# Patient Record
Sex: Male | Born: 1935 | Race: White | Hispanic: No | Marital: Married | Smoking: Former smoker
Health system: Southern US, Community
[De-identification: ages and names within clinical notes are randomized; demographics above are authoritative.]

## PROBLEM LIST (undated history)

## (undated) DIAGNOSIS — F0390 Unspecified dementia without behavioral disturbance: Secondary | ICD-10-CM

## (undated) DIAGNOSIS — I2510 Atherosclerotic heart disease of native coronary artery without angina pectoris: Secondary | ICD-10-CM

## (undated) DIAGNOSIS — R001 Bradycardia, unspecified: Secondary | ICD-10-CM

## (undated) DIAGNOSIS — N400 Enlarged prostate without lower urinary tract symptoms: Secondary | ICD-10-CM

## (undated) DIAGNOSIS — N289 Disorder of kidney and ureter, unspecified: Secondary | ICD-10-CM

## (undated) DIAGNOSIS — I10 Essential (primary) hypertension: Secondary | ICD-10-CM

## (undated) HISTORY — PX: OTHER SURGICAL HISTORY: SHX169

## (undated) HISTORY — DX: Unspecified dementia, unspecified severity, without behavioral disturbance, psychotic disturbance, mood disturbance, and anxiety: F03.90

## (undated) HISTORY — PX: CATARACT EXTRACTION, BILATERAL: SHX1313

## (undated) SURGERY — REPAIR, HERNIA, INGUINAL, ADULT
Anesthesia: General | Laterality: Left

---

## 2006-12-24 HISTORY — PX: HERNIA REPAIR: SHX51

## 2007-05-15 ENCOUNTER — Ambulatory Visit: Payer: Self-pay | Admitting: Internal Medicine

## 2007-08-13 ENCOUNTER — Ambulatory Visit: Payer: Self-pay | Admitting: Surgery

## 2007-08-20 ENCOUNTER — Ambulatory Visit: Payer: Self-pay | Admitting: Surgery

## 2011-06-27 ENCOUNTER — Ambulatory Visit: Payer: Self-pay | Admitting: Family Medicine

## 2012-03-25 ENCOUNTER — Ambulatory Visit: Payer: Self-pay | Admitting: Ophthalmology

## 2013-02-21 ENCOUNTER — Ambulatory Visit: Payer: Self-pay | Admitting: Hematology and Oncology

## 2013-03-23 LAB — CBC CANCER CENTER
Basophil #: 0.1 x10 3/mm (ref 0.0–0.1)
HCT: 48.9 % (ref 40.0–52.0)
HGB: 16.3 g/dL (ref 13.0–18.0)
Lymphocyte #: 1.5 x10 3/mm (ref 1.0–3.6)
MCH: 32.4 pg (ref 26.0–34.0)
MCHC: 33.4 g/dL (ref 32.0–36.0)
MCV: 97 fL (ref 80–100)
Monocyte #: 0.7 x10 3/mm (ref 0.2–1.0)
Neutrophil #: 4.2 x10 3/mm (ref 1.4–6.5)
RBC: 5.04 10*6/uL (ref 4.40–5.90)

## 2013-03-23 LAB — BASIC METABOLIC PANEL
Anion Gap: 7 (ref 7–16)
BUN: 13 mg/dL (ref 7–18)
Chloride: 104 mmol/L (ref 98–107)
Co2: 31 mmol/L (ref 21–32)
Creatinine: 1.33 mg/dL — ABNORMAL HIGH (ref 0.60–1.30)
Glucose: 79 mg/dL (ref 65–99)
Osmolality: 282 (ref 275–301)
Potassium: 4.1 mmol/L (ref 3.5–5.1)
Sodium: 142 mmol/L (ref 136–145)

## 2013-03-24 ENCOUNTER — Ambulatory Visit: Payer: Self-pay | Admitting: Hematology and Oncology

## 2013-08-25 ENCOUNTER — Ambulatory Visit: Payer: Self-pay | Admitting: Family Medicine

## 2013-09-18 ENCOUNTER — Ambulatory Visit: Payer: Self-pay | Admitting: Neurology

## 2014-10-14 ENCOUNTER — Ambulatory Visit: Payer: Self-pay | Admitting: Ophthalmology

## 2015-11-21 ENCOUNTER — Emergency Department: Payer: Medicare Other

## 2015-11-21 ENCOUNTER — Emergency Department
Admission: EM | Admit: 2015-11-21 | Discharge: 2015-11-21 | Disposition: A | Discharge status: Other Acute Inpatient Hospital | Payer: Medicare Other | Attending: Emergency Medicine | Admitting: Emergency Medicine

## 2015-11-21 ENCOUNTER — Encounter: Payer: Self-pay | Admitting: Emergency Medicine

## 2015-11-21 DIAGNOSIS — Y9289 Other specified places as the place of occurrence of the external cause: Secondary | ICD-10-CM | POA: Diagnosis not present

## 2015-11-21 DIAGNOSIS — Z7982 Long term (current) use of aspirin: Secondary | ICD-10-CM | POA: Diagnosis not present

## 2015-11-21 DIAGNOSIS — I10 Essential (primary) hypertension: Secondary | ICD-10-CM | POA: Diagnosis not present

## 2015-11-21 DIAGNOSIS — W01198A Fall on same level from slipping, tripping and stumbling with subsequent striking against other object, initial encounter: Secondary | ICD-10-CM | POA: Insufficient documentation

## 2015-11-21 DIAGNOSIS — S065XAA Traumatic subdural hemorrhage with loss of consciousness status unknown, initial encounter: Secondary | ICD-10-CM

## 2015-11-21 DIAGNOSIS — Z043 Encounter for examination and observation following other accident: Secondary | ICD-10-CM | POA: Diagnosis present

## 2015-11-21 DIAGNOSIS — Z79899 Other long term (current) drug therapy: Secondary | ICD-10-CM | POA: Insufficient documentation

## 2015-11-21 DIAGNOSIS — I6200 Nontraumatic subdural hemorrhage, unspecified: Secondary | ICD-10-CM | POA: Insufficient documentation

## 2015-11-21 DIAGNOSIS — S065X9A Traumatic subdural hemorrhage with loss of consciousness of unspecified duration, initial encounter: Secondary | ICD-10-CM

## 2015-11-21 DIAGNOSIS — Z87891 Personal history of nicotine dependence: Secondary | ICD-10-CM | POA: Insufficient documentation

## 2015-11-21 DIAGNOSIS — Y998 Other external cause status: Secondary | ICD-10-CM | POA: Insufficient documentation

## 2015-11-21 DIAGNOSIS — Y9389 Activity, other specified: Secondary | ICD-10-CM | POA: Diagnosis not present

## 2015-11-21 HISTORY — DX: Bradycardia, unspecified: R00.1

## 2015-11-21 HISTORY — DX: Atherosclerotic heart disease of native coronary artery without angina pectoris: I25.10

## 2015-11-21 HISTORY — DX: Essential (primary) hypertension: I10

## 2015-11-21 HISTORY — DX: Disorder of kidney and ureter, unspecified: N28.9

## 2015-11-21 HISTORY — DX: Benign prostatic hyperplasia without lower urinary tract symptoms: N40.0

## 2015-11-21 LAB — CBC WITH DIFFERENTIAL/PLATELET
Basophils Absolute: 0.1 K/uL (ref 0–0.1)
Basophils Relative: 1 %
Eosinophils Absolute: 0.5 K/uL (ref 0–0.7)
Eosinophils Relative: 6 %
HCT: 44 % (ref 40.0–52.0)
Hemoglobin: 14.8 g/dL (ref 13.0–18.0)
Lymphocytes Relative: 17 %
Lymphs Abs: 1.4 K/uL (ref 1.0–3.6)
MCH: 33 pg (ref 26.0–34.0)
MCHC: 33.5 g/dL (ref 32.0–36.0)
MCV: 98.3 fL (ref 80.0–100.0)
Monocytes Absolute: 0.7 K/uL (ref 0.2–1.0)
Monocytes Relative: 8 %
Neutro Abs: 5.6 K/uL (ref 1.4–6.5)
Neutrophils Relative %: 68 %
Platelets: 169 K/uL (ref 150–440)
RBC: 4.48 MIL/uL (ref 4.40–5.90)
RDW: 14.8 % — ABNORMAL HIGH (ref 11.5–14.5)
WBC: 8.2 K/uL (ref 3.8–10.6)

## 2015-11-21 LAB — URINALYSIS COMPLETE WITH MICROSCOPIC (ARMC ONLY)
Bacteria, UA: NONE SEEN
Bilirubin Urine: NEGATIVE
Glucose, UA: NEGATIVE mg/dL
Ketones, ur: NEGATIVE mg/dL
Leukocytes, UA: NEGATIVE
Nitrite: NEGATIVE
Protein, ur: NEGATIVE mg/dL
Specific Gravity, Urine: 1.009 (ref 1.005–1.030)
pH: 6 (ref 5.0–8.0)

## 2015-11-21 LAB — COMPREHENSIVE METABOLIC PANEL
ALBUMIN: 3.5 g/dL (ref 3.5–5.0)
ALT: 21 U/L (ref 17–63)
AST: 24 U/L (ref 15–41)
Alkaline Phosphatase: 73 U/L (ref 38–126)
Anion gap: 4 — ABNORMAL LOW (ref 5–15)
BUN: 21 mg/dL — AB (ref 6–20)
CALCIUM: 9 mg/dL (ref 8.9–10.3)
CHLORIDE: 108 mmol/L (ref 101–111)
CO2: 26 mmol/L (ref 22–32)
Creatinine, Ser: 1.28 mg/dL — ABNORMAL HIGH (ref 0.61–1.24)
GFR calc Af Amer: 57 mL/min — ABNORMAL LOW (ref >60)
GFR, EST NON AFRICAN AMERICAN: 49 mL/min — AB (ref >60)
Glucose, Bld: 86 mg/dL (ref 65–99)
POTASSIUM: 3.9 mmol/L (ref 3.5–5.1)
SODIUM: 138 mmol/L (ref 135–145)
TOTAL PROTEIN: 6.3 g/dL — AB (ref 6.5–8.1)
Total Bilirubin: 1.5 mg/dL — ABNORMAL HIGH (ref 0.3–1.2)

## 2015-11-21 LAB — APTT: aPTT: 30 s (ref 24–36)

## 2015-11-21 LAB — TROPONIN I: Troponin I: <0.03 ng/mL

## 2015-11-21 LAB — PROTIME-INR
INR: 1.09
Prothrombin Time: 14.3 s (ref 11.4–15.0)

## 2015-11-21 MED ORDER — SODIUM CHLORIDE 0.9 % IV SOLN
1000.0000 mg | Freq: Once | INTRAVENOUS | Status: AC
  Administered 2015-11-21: 1000 mg via INTRAVENOUS
  Filled 2015-11-21: qty 10

## 2015-11-21 NOTE — ED Notes (Signed)
[facility].

## 2015-11-21 NOTE — ED Provider Notes (Signed)
[facility] Emergency Department Provider Note  ____________________________________________  Time seen: Approximately 1130 PM  I have reviewed the triage vital signs and the nursing notes.   HISTORY  Chief Complaint Fall    HPI Andrew Bird is a 81 y.o. male with a history of dementia who is presenting today with an inability to ambulate after a fall last night. The patient is having increased falls over the past several weeks. They said that last night he was trying to get his pants off before going to bed when he tripped over his pants and fell onto his buttocks. The patient has also had difficulty over the past several weeks standing straight. He is not claiming any pain at this time. He is post using a walker at home which she just got one day ago. The family denies any head trauma.   Past Medical History  Diagnosis Date  . Renal disorder   . Coronary artery disease   . Enlarged prostate   . Hypertension   . Bradycardia   . BPH (benign prostatic hyperplasia)     There are no active problems to display for this patient.   Past Surgical History  Procedure Laterality Date  . Coronary artery stent      Current Outpatient Rx  Name  Route  Sig  Dispense  Refill  . aspirin EC 81 MG tablet   Oral   Take 81 mg by mouth daily.         Marland Kitchen atorvastatin (LIPITOR) 40 MG tablet   Oral   Take 40 mg by mouth daily.         . cholecalciferol (VITAMIN D) 400 UNITS TABS tablet   Oral   Take 2,000 Units by mouth daily.         . clobetasol cream (TEMOVATE) 0.05 %   Topical   Apply 1 application topically 2 (two) times daily.         . finasteride (PROSCAR) 5 MG tablet   Oral   Take 5 mg by mouth daily.         Marland Kitchen lisinopril (PRINIVIL,ZESTRIL) 10 MG tablet   Oral   Take 10 mg by mouth daily.         . ranitidine (ZANTAC) 150 MG tablet   Oral   Take 150 mg by mouth 2 (two) times daily.         . tamsulosin (FLOMAX) 0.4 MG CAPS  capsule   Oral   Take 0.4 mg by mouth daily.         . timolol (TIMOPTIC) 0.5 % ophthalmic solution   Both Eyes   Place 1 drop into both eyes daily.           Allergies Review of patient's allergies indicates no known allergies.  No family history on file.  Social History Social History  Substance Use Topics  . Smoking status: Former Games developer  . Smokeless tobacco: None  . Alcohol Use: No    Review of Systems Constitutional: No fever/chills Eyes: No visual changes. ENT: No sore throat. Cardiovascular: Denies chest pain. Respiratory: Denies shortness of breath. Gastrointestinal: No abdominal pain.  No nausea, no vomiting.  No diarrhea.  No constipation. Genitourinary: Negative for dysuria. Musculoskeletal: Negative for back pain. Skin: Negative for rash. Neurological: Negative for headaches, focal weakness or numbness.  10-point ROS otherwise negative. However, examination found by the patient's dementia.  ____________________________________________   PHYSICAL EXAM:  VITAL SIGNS: ED Triage Vitals  Enc  Vitals Group     BP 11/21/15 1136 130/89 mmHg     Pulse Rate 11/21/15 1136 72     Resp 11/21/15 1136 18     Temp 11/21/15 1136 97.6 F (36.4 C)     Temp Source 11/21/15 1136 Oral     SpO2 11/21/15 1136 98 %     Weight 11/21/15 1136 171 lb (77.565 kg)     Height --      Head Cir --      Peak Flow --      Pain Score --      Pain Loc --      Pain Edu? --      Excl. in GC? --     Constitutional: Alert and oriented. Well appearing and in no acute distress. Eyes: Conjunctivae are normal. PERRL. EOMI. Head: Atraumatic. Nose: No congestion/rhinnorhea. Mouth/Throat: Mucous membranes are moist.  Oropharynx non-erythematous. Neck: No stridor.  No C-spine tenderness or deformity or step-off. Cardiovascular: Normal rate, regular rhythm. Grossly normal heart sounds.  Good peripheral circulation. Respiratory: Normal respiratory effort.  No retractions. Lungs  CTAB. Gastrointestinal: Soft and nontender. No distention. No abdominal bruits. No CVA tenderness. Musculoskeletal: No lower extremity tenderness nor edema.  No joint effusions. 5 out of 5 strength to bilateral lower extremity sprain no tenderness, step-off or deformity to the spine. No saddle anesthesia. Neurologic:  Normal speech and language. No gross focal neurologic deficits are appreciated. No gait instability. Skin:  Skin is warm, dry and intact. No rash noted. Psychiatric: Mood and affect are normal. Speech and behavior are normal.  ____________________________________________   LABS (all labs ordered are listed, but only abnormal results are displayed)  Labs Reviewed - No data to display ____________________________________________  EKG  ED ECG REPORT I, Schaevitz,  Teena Irani, the attending physician, personally viewed and interpreted this ECG.   Date: 11/21/2015  EKG Time: 1409  Rate: 74  Rhythm: normal sinus rhythm with PVC times one.  Axis: Normal axis  Intervals:right bundle branch block  ST&T Change: No ST segment elevation or depression. No abnormal T-wave inversion.  ____________________________________________  RADIOLOGY  Lumbar spine with degenerative disc and facet changes and chronic and plate compression of L2. Aneurysmal dilatation up to 4.8 cm. Recommended follow-up for ultrasound. No acute osseous abdomen the in the pelvis.  . Subdural left-sided hematoma with acute and chronic components. There is a about a 1.5 cm shift. ____________________________________________   PROCEDURES   CRITICAL CARE Performed by: Arelia Longest   Total critical care time: 35 minutes  Critical care time was exclusive of separately billable procedures and treating other patients.  Critical care was necessary to treat or prevent imminent or life-threatening deterioration.  Critical care was time spent personally by me on the following activities: development of  treatment plan with patient and/or surrogate as well as nursing, discussions with consultants, evaluation of patient's response to treatment, examination of patient, obtaining history from patient or surrogate, ordering and performing treatments and interventions, ordering and review of laboratory studies, ordering and review of radiographic studies, pulse oximetry and re-evaluation of patient's condition.  ____________________________________________   INITIAL IMPRESSION / ASSESSMENT AND PLAN / ED COURSE  Pertinent labs & imaging results that were available during my care of the patient were reviewed by me and considered in my medical decision making (see chart for details). ----------------------------------------- 12:59 PM on 11/21/2015 -----------------------------------------  Family is aware of the aortic aneurysm but the last time it was imaged the lites  as well as several years ago. I recommend that they follow-up for reimaging. I further discussed the forgetfulness which the family says is worse over the past several days. The patient also lives at home with his wife who is unable to help him with transfers such as getting off the commode. I was able to get the patient to stand and take several steps but only after lifting almost his full weight when the patient was standing. He was only able to take several short steps and needed a large amount of support with sitting back down. His wife says that she is almost 79 years old and has rheumatoid arthritis and is unable to assist with these transfers. We'll consult social work for further evaluation.  ----------------------------------------- 155 PM on 11/21/2015 ----------------------------------------- Discussed radiology results the family and call made to do transfer center. Discussed initially with transfer center who is alerting the neuro hospitalist and will call back here to [place]. The family says the patient has his primary care  [facility] and would like to be transferred there for further workup and treatment. I also reconfirmed that the patient is only taking aspirin daily as an anticoagulant and they confirmed.  ----------------------------------------- 2:12 PM on 11/21/2015 -----------------------------------------  Discussed case with Dr. Gretel AcreGrafagnino, the [facility] neuro intensivist. He except that the patient was waiting for a bed assignment at this time. I discussed the findings of the radiology report with him and he does not recommend platelets or any further intervention.  ----------------------------------------- 3:45 PM on 11/21/2015 -----------------------------------------  Patient without any focal neurologic findings. Family aware that the patient will need to be flown via helicopter because this is the [facility] way [facility]. They understand and are willing to comply. ____________________________________________   FINAL CLINICAL IMPRESSION(S) / ED DIAGNOSES  Subdural hemorrhage.    Myrna Blazeravid Matthew Schaevitz, MD 11/21/15 [phone]

## 2015-11-21 NOTE — ED Notes (Addendum)
[facility] arrived.

## 2015-11-21 NOTE — ED Notes (Signed)
Pt 's wife called EMS this morning after patient has gotten progressively worse, having 5 falls in the last week and increasing forgetfulness.  Pt's wife reports that he now cannot get out of the chair.  On arrival, pt denies pain and does not remember falling last night.

## 2015-11-22 HISTORY — PX: BURR HOLE FOR SUBDURAL HEMATOMA: SHX1275

## 2015-12-27 ENCOUNTER — Ambulatory Visit: Payer: No Typology Code available for payment source | Admitting: Sports Medicine

## 2015-12-30 ENCOUNTER — Ambulatory Visit: Payer: Medicare Other | Admitting: Sports Medicine

## 2020-02-07 ENCOUNTER — Ambulatory Visit: Payer: Medicare Other | Attending: Internal Medicine

## 2020-02-07 DIAGNOSIS — Z23 Encounter for immunization: Secondary | ICD-10-CM | POA: Insufficient documentation

## 2020-02-07 NOTE — Progress Notes (Signed)
   Covid-19 Vaccination Clinic  Name:  MABRY TIFT    MRN: [id number] DOB: [birth year]  02/07/2020  Mr. Bourdon was observed post Covid-19 immunization for 15 minutes without incidence. He was provided with Vaccine Information Sheet and instruction to access the V-Safe system.   Mr. Dilone was instructed to call 911 with any severe reactions post vaccine: Marland Kitchen Difficulty breathing  . Swelling of your face and throat  . A fast heartbeat  . A bad rash all over your body  . Dizziness and weakness    Immunizations Administered    Name Date Dose VIS Date Route   Pfizer COVID-19 Vaccine 02/07/2020  9:36 AM 0.3 mL 12/04/2019 Intramuscular   Manufacturer: [employer], [employer]   Lot: [id number]   NDC: [address]-0

## 2020-03-02 ENCOUNTER — Ambulatory Visit: Payer: Medicare Other | Attending: Internal Medicine

## 2020-03-02 DIAGNOSIS — Z23 Encounter for immunization: Secondary | ICD-10-CM

## 2020-03-02 NOTE — Progress Notes (Signed)
   Covid-19 Vaccination Clinic  Name:  Andrew Bird    MRN: [id number] DOB: [birth year]  03/02/2020  Mr. Andrew Bird was observed post Covid-19 immunization for 15 minutes without incident. He was provided with Vaccine Information Sheet and instruction to access the V-Safe system.   Andrew Bird was instructed to call 911 with any severe reactions post vaccine: Marland Kitchen Difficulty breathing  . Swelling of face and throat  . A fast heartbeat  . A bad rash all over body  . Dizziness and weakness   Immunizations Administered    Name Date Dose VIS Date Route   Pfizer COVID-19 Vaccine 03/02/2020  2:36 PM 0.3 mL 12/04/2019 Intramuscular   Manufacturer: [employer], [employer]   Lot: [id number]   NDC: [address]-3

## 2020-07-25 ENCOUNTER — Encounter: Payer: Self-pay | Admitting: Surgery

## 2020-07-25 ENCOUNTER — Ambulatory Visit (INDEPENDENT_AMBULATORY_CARE_PROVIDER_SITE_OTHER): Payer: Medicare Other | Admitting: Surgery

## 2020-07-25 ENCOUNTER — Other Ambulatory Visit: Payer: Self-pay

## 2020-07-25 VITALS — BP 110/64 | HR 65 | Temp 97.9°F | Ht 72.0 in | Wt 153.6 lb

## 2020-07-25 DIAGNOSIS — K4090 Unilateral inguinal hernia, without obstruction or gangrene, not specified as recurrent: Secondary | ICD-10-CM

## 2020-07-25 NOTE — Progress Notes (Unsigned)
Medical Clearance was faxed over to Dr.Mario Olmedo's office today at [phone]

## 2020-07-25 NOTE — Progress Notes (Signed)
07/25/2020  Reason for Visit:  Left inguinal hernia  Referring Provider:  Dione Housekeeper, MD  History of Present Illness: Andrew Bird is a 86 y.o. male presenting for evaluation of an enlarging left inguinal hernia.  The patient has a history of HTN, CKD3, CAD, HLP, and senile dementia.  The patient's wife is with him. She reports that he has had a left inguinal hernia for some time now, is unable to tell me exactly how long, but it appears that it's been a few years.  He also has a history of a hernia repair, but she does not remember which side.  Looking in Epic, there's a unilateral inguinal hernia admission with Dr. Katrinka Blazing in 07/2007, but it does not mention which side, or how the hernia was repaired.  The patient's wife reports that more recently about a few weeks ago, he's been having more episodes of discomfort and pain in the left groin.  The pain happens when the hernia bulges, and she reports that it would be more difficult for him to walk or get up from chair.  He lies flat and the bulging improves.  Denies any issues with constipation, nausea, or vomiting, or issues with urination other than his BPH.  Patient also has history of coronary artery stent many years ago.  He has a history of subdural hematoma which required Burr hole in 2016.  He's not currently on any anticoagulants or antiplatelets.  Nonetheless, the patient is still functional in that he can walk using his cane, does his daily activities without assistance.   Past Medical History: Past Medical History:  Diagnosis Date  . BPH (benign prostatic hyperplasia)   . Bradycardia   . Coronary artery disease   . Enlarged prostate   . Hypertension   . Renal disorder   . Senile dementia [facility])      Past Surgical History: Past Surgical History:  Procedure Laterality Date  . coronary artery stent    . HERNIA REPAIR  2008    Home Medications: Prior to Admission medications   Medication Sig Start Date End Date  Taking? Authorizing Provider  aspirin EC 81 MG tablet Take 81 mg by mouth daily.    [provider]  atorvastatin (LIPITOR) 40 MG tablet Take 40 mg by mouth daily.    [provider]  cholecalciferol (VITAMIN D) 400 UNITS TABS tablet Take 2,000 Units by mouth daily.    [provider]  clobetasol cream (TEMOVATE) 0.05 % Apply 1 application topically 2 (two) times daily.    [provider]  finasteride (PROSCAR) 5 MG tablet Take 5 mg by mouth daily.    [provider]  lisinopril (PRINIVIL,ZESTRIL) 10 MG tablet Take 10 mg by mouth daily.    [provider]  ranitidine (ZANTAC) 150 MG tablet Take 150 mg by mouth 2 (two) times daily.    [provider]  tamsulosin (FLOMAX) 0.4 MG CAPS capsule Take 0.4 mg by mouth daily.    [provider]  timolol (TIMOPTIC) 0.5 % ophthalmic solution Place 1 drop into both eyes daily.    [provider]    Allergies: Allergies  Allergen Reactions  . Aspirin Other (See Comments)    Other reaction(s): Blood Disorder Intracranial bleed Intracranial bleed     Social History:  reports that he has quit smoking. He does not have any smokeless tobacco history on file. He reports that he does not drink alcohol. No history on file for drug use.  Family History: History reviewed. No pertinent family history.  Review of Systems: Review of Systems  Constitutional: Negative for chills and fever.  HENT: Negative for hearing loss.        Chronic hearing difficulties  Respiratory: Negative for shortness of breath.   Cardiovascular: Negative for chest pain.  Gastrointestinal: Negative for abdominal pain, constipation, diarrhea, nausea and vomiting.  Genitourinary: Negative for dysuria.  Musculoskeletal: Negative for myalgias.  Skin: Negative for rash.  Neurological: Negative for dizziness.       Worsening senile dementia  Psychiatric/Behavioral: Negative for depression.     Physical Exam BP (!) 110/64   Pulse 65   Temp 97.9 F (36.6 C) (Oral)   Ht 6' (1.829 m)   Wt 153 lb 9.6 oz (69.7 kg)   SpO2 96%   BMI 20.83 kg/m  CONSTITUTIONAL: No acute distress HEENT:  Normocephalic, atraumatic, extraocular motion intact.  Has hearing aids in place. NECK: Trachea is midline, and there is no jugular venous distension.  RESPIRATORY:  Lungs are clear, and breath sounds are equal bilaterally. Normal respiratory effort without pathologic use of accessory muscles. CARDIOVASCULAR: Heart is regular without murmurs, gallops, or rubs. GI: The abdomen is soft, non-distended, non-tender.  There are faint small incisions from what appears to have been a laparoscopic hernia repair.  However, unclear again which side.  He has a reducible, non-tender left inguinal hernia.  No hernia on the right side or at the umbilicus.  MUSCULOSKELETAL:  Normal muscle strength and tone in all four extremities.  No peripheral edema or cyanosis. SKIN: Skin turgor is normal. There are no pathologic skin lesions.  NEUROLOGIC:  Motor and sensation is grossly normal.  Cranial nerves are grossly intact. PSYCH:  Alert and oriented to person, place and time. Affect is normal.  Laboratory Analysis: Labs from 05/09/20: Na 142, K 4.8, Cl 108, CO2 26, BUN 26, Cr 1.5.    Imaging: No results found.  Assessment and Plan: This is a 86 y.o. male with a now more symptomatic left inguinal hernia.  --Discussed with the patient and his wife that we have two options to try help with his symptoms.  One would be conservative measures and wear a hernia truss or underwear to help put pressure in the groin to try prevent hernia bulging.  However, this is not a solution.  Since he is symptomatic, he does have risk of worsening symptoms.  The other option would be to proceed with surgery.  However, given his dementia, there is a possibility that after general anesthesia, his confusion may be worse initially until the  anesthetic fully wears off.  Since he had laparoscopic procedure before, we would attempt an open procedure instead.  At this point, it is unclear if it was the left side that was repaired before or not.   --Discussed with them the risks of bleeding, infection, injury to surrounding structures.  They're willing to proceed with surgery.  We will send medical clearance form to his PCP.  They will discuss with their daughter about the timing for surgery so that she can stay with them to help with the patient.  They will call me back with what dates would be better for their daughter to help, so that we can schedule him on our end for an open left inguinal hernia repair.  Face-to-face time spent with the patient and care providers was 60 minutes, with more than 50% of the time spent counseling, educating, and coordinating care of the patient.  Melvyn Neth, [facility]

## 2020-07-25 NOTE — Patient Instructions (Addendum)
Our surgery scheduler Britta Mccreedy will contact you within the next 24-48 hours. During that call, she will discuss the preparation prior to surgery and discuss the date and time for surgery. Please have the BLUE sheet available when she contacts you. If you have any questions or concerns regarding the surgery, please do not hesitate to give our office a call.   Inguinal Hernia, Adult An inguinal hernia develops when fat or the intestines push through a weak spot in a muscle where your leg meets your lower abdomen (groin). This creates a bulge. This kind of hernia could also be:  In your scrotum, if you are male.  In folds of skin around your vagina, if you are male. There are three types of inguinal hernias:  Hernias that can be pushed back into the abdomen (are reducible). This type rarely causes pain.  Hernias that are not reducible (are incarcerated).  Hernias that are not reducible and lose their blood supply (are strangulated). This type of hernia requires emergency surgery. What are the causes? This condition is caused by having a weak spot in the muscles or tissues in the groin. This weak spot develops over time. The hernia may poke through the weak spot when you suddenly strain your lower abdominal muscles, such as when you:  Lift a heavy object.  Strain to have a bowel movement. Constipation can lead to straining.  Cough. What increases the risk? This condition is more likely to develop in:  Men.  Pregnant women.  People who: ? Are overweight. ? Work in jobs that require long periods of standing or heavy lifting. ? Have had an inguinal hernia before. ? Smoke or have lung disease. These factors can lead to long-lasting (chronic) coughing. What are the signs or symptoms? Symptoms may depend on the size of the hernia. Often, a small inguinal hernia has no symptoms. Symptoms of a larger hernia may include:  A lump in the groin area. This is easier to see when standing. It  might not be visible when lying down.  Pain or burning in the groin. This may get worse when lifting, straining, or coughing.  A dull ache or a feeling of pressure in the groin.  In men, an unusual lump in the scrotum. Symptoms of a strangulated inguinal hernia may include:  A bulge in your groin that is very painful and tender to the touch.  A bulge that turns red or purple.  Fever, nausea, and vomiting.  Inability to have a bowel movement or to pass gas. How is this diagnosed? This condition is diagnosed based on your symptoms, your medical history, and a physical exam. Your health care provider may feel your groin area and ask you to cough. How is this treated? Treatment depends on the size of your hernia and whether you have symptoms. If you do not have symptoms, your health care provider may have you watch your hernia carefully and have you come in for follow-up visits. If your hernia is large or if you have symptoms, you may need surgery to repair the hernia. Follow these instructions at home: Lifestyle  Avoid lifting heavy objects.  Avoid standing for long periods of time.  Do not use any products that contain nicotine or tobacco, such as cigarettes and e-cigarettes. If you need help quitting, ask your health care provider.  Maintain a healthy weight. Preventing constipation  Take actions to prevent constipation. Constipation leads to straining with bowel movements, which can make a hernia worse or cause  a hernia repair to break down. Your health care provider may recommend that you: ? Drink enough fluid to keep your urine pale yellow. ? Eat foods that are high in fiber, such as fresh fruits and vegetables, whole grains, and beans. ? Limit foods that are high in fat and processed sugars, such as fried or sweet foods. ? Take an over-the-counter or prescription medicine for constipation. General instructions  You may try to push the hernia back in place by very gently  pressing on it while lying down. Do not try to force the bulge back in if it will not push in easily.  Watch your hernia for any changes in shape, size, or color. Get help right away if you notice any changes.  Take over-the-counter and prescription medicines only as told by your health care provider.  Keep all follow-up visits as told by your health care provider. This is important. Contact a health care provider if:  You have a fever.  You develop new symptoms.  Your symptoms get worse. Get help right away if:  You have pain in your groin that suddenly gets worse.  You have a bulge in your groin that: ? Suddenly gets bigger and does not get smaller. ? Becomes red or purple or painful to the touch.  You are a man and you have a sudden pain in your scrotum, or the size of your scrotum suddenly changes.  You cannot push the hernia back in place by very gently pressing on it when you are lying down. Do not try to force the bulge back in if it will not push in easily.  You have nausea or vomiting that does not go away.  You have a fast heartbeat.  You cannot have a bowel movement or pass gas. These symptoms may represent a serious problem that is an emergency. Do not wait to see if the symptoms will go away. Get medical help right away. Call your local emergency services (911 in the [place] Summary  An inguinal hernia develops when fat or the intestines push through a weak spot in a muscle where your leg meets your lower abdomen (groin).  This condition is caused by having a weak spot in muscles or tissue in your groin.  Symptoms may depend on the size of the hernia, and they may include pain or swelling in your groin. A small inguinal hernia often has no symptoms.  Treatment may not be needed if you do not have symptoms. If you have symptoms or a large hernia, you may need surgery to repair the hernia.  Avoid lifting heavy objects. Also avoid standing for long amounts of  time. This information is not intended to replace advice given to you by your health care provider. Make sure you discuss any questions you have with your health care provider. Document Revised: 01/11/2018 Document Reviewed: 09/11/2017 Elsevier Patient Education  2020 [employer]   Laparoscopic Inguinal Hernia Repair, Adult, Care After This sheet gives you information about how to care for yourself after your procedure. Your health care provider may also give you more specific instructions. If you have problems or questions, contact your health care provider. What can I expect after the procedure? After the procedure, it is common to have:  Pain.  Swelling and bruising around the incision area.  Scrotal swelling, in men.  Some fluid or blood draining from your incisions. Follow these instructions at home: Incision care  Follow instructions from your health care provider about how  to take care of your incisions. Make sure you: ? Wash your hands with soap and water before you change your bandage (dressing). If soap and water are not available, use hand sanitizer. ? Change your dressing as told by your health care provider. ? Leave stitches (sutures), skin glue, or adhesive strips in place. These skin closures may need to stay in place for 2 weeks or longer. If adhesive strip edges start to loosen and curl up, you may trim the loose edges. Do not remove adhesive strips completely unless your health care provider tells you to do that.  Check your incision area every day for signs of infection. Check for: ? More redness, swelling, or pain. ? More fluid or blood. ? Warmth. ? Pus or a bad smell.  Wear loose, soft clothing while your incisions heal. Driving  Do not drive or use heavy machinery while taking prescription pain medicine.  Do not drive for 24 hours if you were given a medicine to help you relax (sedative) during your procedure. Activity  Do not lift anything that is  heavier than 10 lb (4.5 kg), or the limit that you are told, until your health care provider says that it is safe.  Ask your health care provider what activities are safe for you. A lot of activity during the first week after surgery can increase pain and swelling. For 1 week after your procedure: ? Avoid activities that take a lot of effort, such as exercise or sports. ? You may walk and climb stairs as needed for daily activity, but avoid long walks or climbing stairs for exercise. Managing pain and swelling   Put ice on painful or swollen areas: ? Put ice in a plastic bag. ? Place a towel between your skin and the bag. ? Leave the ice on for 20 minutes, 2-3 times a day. General instructions  Do not take baths, swim, or use a hot tub until your health care provider approves. Ask your health care provider if you may take showers. You may only be allowed to take sponge baths.  Take over-the-counter and prescription medicines only as told by your health care provider.  To prevent or treat constipation while you are taking prescription pain medicine, your health care provider may recommend that you: ? Drink enough fluid to keep your urine pale yellow. ? Take over-the-counter or prescription medicines. ? Eat foods that are high in fiber, such as fresh fruits and vegetables, whole grains, and beans. ? Limit foods that are high in fat and processed sugars, such as fried and sweet foods.  Do not use any products that contain nicotine or tobacco, such as cigarettes and e-cigarettes. If you need help quitting, ask your health care provider.  Drink enough fluid to keep your urine pale yellow.  Keep all follow-up visits as told by your health care provider. This is important. Contact a health care provider if:  You have more redness, swelling, or pain around your incisions or your groin area.  You have more swelling in your scrotum.  You have more fluid or blood coming from your  incisions.  Your incisions feel warm to the touch.  You have severe pain and medicines do not help.  You have abdominal pain or swelling.  You cannot eat or drink without vomiting.  You cannot urinate or pass a bowel movement.  You faint.  You feel dizzy.  You have nausea and vomiting.  You have a fever. Get help right away if:  You have pus or a bad smell coming from your incisions.  You have redness, warmth, or pain in your leg.  You have chest pain.  You have problems breathing. Summary  Pain, swelling, and bruising are common after the procedure.  Check your incision area every day for signs of infection, such as more redness, swelling, or pain.  Put ice on painful or swollen areas for 20 minutes, 2-3 times a day. This information is not intended to replace advice given to you by your health care provider. Make sure you discuss any questions you have with your health care provider. Document Revised: 05/20/2019 Document Reviewed: 03/21/2017 Elsevier Patient Education  2020 [employer]

## 2020-07-26 ENCOUNTER — Telehealth: Payer: Self-pay | Admitting: Surgery

## 2020-07-26 NOTE — Telephone Encounter (Signed)
Received call from wife, Andrew Bird.  She states that one of her children will be able to help.  But at this time we have not received medical clearance.  I have mentioned to her that clearance has not been obtained yet, it is in process.  She states that until we get medical clearance will hold off on scheduling surgery until then and that they may need to wait until September for scheduling.

## 2020-08-16 ENCOUNTER — Telehealth: Payer: Self-pay | Admitting: Surgery

## 2020-08-16 NOTE — Telephone Encounter (Signed)
Outgoing call is made.  No answer, was not able to leave a message.  Checking on whether or not patient's wife and family want to proceed with surgery.

## 2020-08-17 ENCOUNTER — Telehealth: Payer: Self-pay | Admitting: Surgery

## 2020-08-17 NOTE — Telephone Encounter (Signed)
Received return call from wife, Andrew Bird.  She states that her husband did see his primary care doctor and at this time the patient and his family are holding off on doing surgery.  The wife states that her sister has been really sick, taking care of her and also having to take care of her husband.  She states that her husband at this time is doing ok and they just want to hold off for now.  She is worried with the rise of Covid.  They will await a couple of months or so and see how things are then.  She is informed that if he should get worse to please call us so that we can get him in to see Dr. Aleen Campi.  She voices understanding.

## 2021-01-27 ENCOUNTER — Emergency Department: Payer: Medicare Other

## 2021-01-27 ENCOUNTER — Other Ambulatory Visit: Payer: Self-pay

## 2021-01-27 ENCOUNTER — Inpatient Hospital Stay
Admission: EM | Admit: 2021-01-27 | Discharge: 2021-02-02 | DRG: 682 | Disposition: A | Discharge status: Skilled Nursing Facility | Payer: Medicare Other | Attending: Internal Medicine | Admitting: Internal Medicine

## 2021-01-27 ENCOUNTER — Encounter: Payer: Self-pay | Admitting: Internal Medicine

## 2021-01-27 DIAGNOSIS — I129 Hypertensive chronic kidney disease with stage 1 through stage 4 chronic kidney disease, or unspecified chronic kidney disease: Secondary | ICD-10-CM | POA: Diagnosis present

## 2021-01-27 DIAGNOSIS — F0390 Unspecified dementia without behavioral disturbance: Secondary | ICD-10-CM | POA: Diagnosis present

## 2021-01-27 DIAGNOSIS — E785 Hyperlipidemia, unspecified: Secondary | ICD-10-CM | POA: Diagnosis present

## 2021-01-27 DIAGNOSIS — N400 Enlarged prostate without lower urinary tract symptoms: Secondary | ICD-10-CM

## 2021-01-27 DIAGNOSIS — Z8673 Personal history of transient ischemic attack (TIA), and cerebral infarction without residual deficits: Secondary | ICD-10-CM

## 2021-01-27 DIAGNOSIS — R339 Retention of urine, unspecified: Secondary | ICD-10-CM | POA: Diagnosis present

## 2021-01-27 DIAGNOSIS — I2510 Atherosclerotic heart disease of native coronary artery without angina pectoris: Secondary | ICD-10-CM | POA: Diagnosis present

## 2021-01-27 DIAGNOSIS — Z20822 Contact with and (suspected) exposure to covid-19: Secondary | ICD-10-CM | POA: Diagnosis present

## 2021-01-27 DIAGNOSIS — I10 Essential (primary) hypertension: Secondary | ICD-10-CM

## 2021-01-27 DIAGNOSIS — N1831 Chronic kidney disease, stage 3a: Secondary | ICD-10-CM | POA: Diagnosis present

## 2021-01-27 DIAGNOSIS — G40909 Epilepsy, unspecified, not intractable, without status epilepticus: Secondary | ICD-10-CM | POA: Diagnosis present

## 2021-01-27 DIAGNOSIS — Z87891 Personal history of nicotine dependence: Secondary | ICD-10-CM

## 2021-01-27 DIAGNOSIS — Z66 Do not resuscitate: Secondary | ICD-10-CM | POA: Diagnosis not present

## 2021-01-27 DIAGNOSIS — Z7982 Long term (current) use of aspirin: Secondary | ICD-10-CM | POA: Diagnosis not present

## 2021-01-27 DIAGNOSIS — Z515 Encounter for palliative care: Secondary | ICD-10-CM | POA: Diagnosis not present

## 2021-01-27 DIAGNOSIS — R1310 Dysphagia, unspecified: Secondary | ICD-10-CM | POA: Diagnosis present

## 2021-01-27 DIAGNOSIS — Z7189 Other specified counseling: Secondary | ICD-10-CM | POA: Diagnosis not present

## 2021-01-27 DIAGNOSIS — E43 Unspecified severe protein-calorie malnutrition: Secondary | ICD-10-CM | POA: Diagnosis present

## 2021-01-27 DIAGNOSIS — G9341 Metabolic encephalopathy: Secondary | ICD-10-CM | POA: Diagnosis present

## 2021-01-27 DIAGNOSIS — R4182 Altered mental status, unspecified: Secondary | ICD-10-CM

## 2021-01-27 DIAGNOSIS — Z681 Body mass index (BMI) 19 or less, adult: Secondary | ICD-10-CM | POA: Diagnosis not present

## 2021-01-27 DIAGNOSIS — R338 Other retention of urine: Secondary | ICD-10-CM | POA: Diagnosis present

## 2021-01-27 DIAGNOSIS — Z79899 Other long term (current) drug therapy: Secondary | ICD-10-CM | POA: Diagnosis not present

## 2021-01-27 DIAGNOSIS — N401 Enlarged prostate with lower urinary tract symptoms: Secondary | ICD-10-CM | POA: Diagnosis present

## 2021-01-27 DIAGNOSIS — N179 Acute kidney failure, unspecified: Principal | ICD-10-CM | POA: Diagnosis present

## 2021-01-27 DIAGNOSIS — Z955 Presence of coronary angioplasty implant and graft: Secondary | ICD-10-CM

## 2021-01-27 DIAGNOSIS — N1832 Chronic kidney disease, stage 3b: Secondary | ICD-10-CM | POA: Diagnosis present

## 2021-01-27 LAB — CBC WITH DIFFERENTIAL/PLATELET
Abs Immature Granulocytes: 0.02 10*3/uL (ref 0.00–0.07)
Basophils Absolute: 0.1 10*3/uL (ref 0.0–0.1)
Basophils Relative: 1 %
Eosinophils Absolute: 0.5 10*3/uL (ref 0.0–0.5)
Eosinophils Relative: 7 %
HCT: 39 % (ref 39.0–52.0)
Hemoglobin: 13.1 g/dL (ref 13.0–17.0)
Immature Granulocytes: 0 %
Lymphocytes Relative: 17 %
Lymphs Abs: 1.1 10*3/uL (ref 0.7–4.0)
MCH: 33.4 pg (ref 26.0–34.0)
MCHC: 33.6 g/dL (ref 30.0–36.0)
MCV: 99.5 fL (ref 80.0–100.0)
Monocytes Absolute: 0.7 10*3/uL (ref 0.1–1.0)
Monocytes Relative: 10 %
Neutro Abs: 4 10*3/uL (ref 1.7–7.7)
Neutrophils Relative %: 65 %
Platelets: 143 10*3/uL — ABNORMAL LOW (ref 150–400)
RBC: 3.92 MIL/uL — ABNORMAL LOW (ref 4.22–5.81)
RDW: 13.1 % (ref 11.5–15.5)
WBC: 6.3 10*3/uL (ref 4.0–10.5)
nRBC: 0 % (ref 0.0–0.2)

## 2021-01-27 LAB — URINALYSIS, COMPLETE (UACMP) WITH MICROSCOPIC
Bacteria, UA: NONE SEEN
Bilirubin Urine: NEGATIVE
Glucose, UA: NEGATIVE mg/dL
Hgb urine dipstick: NEGATIVE
Ketones, ur: NEGATIVE mg/dL
Leukocytes,Ua: NEGATIVE
Nitrite: NEGATIVE
Protein, ur: NEGATIVE mg/dL
Specific Gravity, Urine: 1.018 (ref 1.005–1.030)
Squamous Epithelial / HPF: NONE SEEN (ref 0–5)
pH: 6 (ref 5.0–8.0)

## 2021-01-27 LAB — COMPREHENSIVE METABOLIC PANEL
ALT: 16 U/L (ref 0–44)
AST: 20 U/L (ref 15–41)
Albumin: 3.9 g/dL (ref 3.5–5.0)
Alkaline Phosphatase: 58 U/L (ref 38–126)
Anion gap: 10 (ref 5–15)
BUN: 43 mg/dL — ABNORMAL HIGH (ref 8–23)
CO2: 23 mmol/L (ref 22–32)
Calcium: 9.1 mg/dL (ref 8.9–10.3)
Chloride: 107 mmol/L (ref 98–111)
Creatinine, Ser: 2.06 mg/dL — ABNORMAL HIGH (ref 0.61–1.24)
GFR, Estimated: 30 mL/min — ABNORMAL LOW (ref >60)
Glucose, Bld: 99 mg/dL (ref 70–99)
Potassium: 4.3 mmol/L (ref 3.5–5.1)
Sodium: 140 mmol/L (ref 135–145)
Total Bilirubin: 1.1 mg/dL (ref 0.3–1.2)
Total Protein: 6.6 g/dL (ref 6.5–8.1)

## 2021-01-27 LAB — SARS CORONAVIRUS 2 BY RT PCR (HOSPITAL ORDER, PERFORMED IN ~~LOC~~ HOSPITAL LAB): SARS Coronavirus 2: NEGATIVE

## 2021-01-27 LAB — AMMONIA: Ammonia: 23 umol/L (ref 9–35)

## 2021-01-27 LAB — TSH: TSH: 1.722 u[IU]/mL (ref 0.350–4.500)

## 2021-01-27 MED ORDER — HALOPERIDOL LACTATE 5 MG/ML IJ SOLN
2.0000 mg | Freq: Once | INTRAMUSCULAR | Status: AC
  Administered 2021-01-27: 2 mg via INTRAVENOUS
  Filled 2021-01-27: qty 1

## 2021-01-27 MED ORDER — LACTATED RINGERS IV BOLUS: 1000.0000 mL | Freq: Once | INTRAVENOUS | Status: DC

## 2021-01-27 MED ORDER — LACTATED RINGERS IV SOLN: INTRAVENOUS | Status: AC

## 2021-01-27 MED ORDER — HALOPERIDOL LACTATE 5 MG/ML IJ SOLN
5.0000 mg | Freq: Four times a day (QID) | INTRAMUSCULAR | Status: DC | PRN
  Administered 2021-01-28 – 2021-01-30 (×6): 5 mg via INTRAVENOUS
  Filled 2021-01-27 (×7): qty 1

## 2021-01-27 MED ORDER — HYDRALAZINE HCL 20 MG/ML IJ SOLN: 5.0000 mg | Freq: Once | INTRAMUSCULAR | Status: DC

## 2021-01-27 MED ORDER — HALOPERIDOL LACTATE 5 MG/ML IJ SOLN
5.0000 mg | Freq: Once | INTRAMUSCULAR | Status: AC
  Administered 2021-01-27: 5 mg via INTRAVENOUS
  Filled 2021-01-27: qty 1

## 2021-01-27 MED ORDER — ACETAMINOPHEN 325 MG PO TABS
650.0000 mg | ORAL_TABLET | Freq: Four times a day (QID) | ORAL | Status: DC | PRN
  Administered 2021-02-01: 650 mg via ORAL
  Filled 2021-01-27: qty 2

## 2021-01-27 MED ORDER — ACETAMINOPHEN 650 MG RE SUPP: 650.0000 mg | Freq: Four times a day (QID) | RECTAL | Status: DC | PRN

## 2021-01-27 NOTE — ED Notes (Signed)
Attempted to notifiy family. No answer

## 2021-01-27 NOTE — ED Notes (Signed)
Family at bedside. Updated on restraints and plan of care

## 2021-01-27 NOTE — ED Notes (Signed)
Per pt wife. 3 days ago pt got up to go to bathroom, came back to bed and wouldn't respond to wife for "5 minutes." Wife reports pt "speaking gibberish, not using real words" yesterday afternoon. Wife reports sharp decline over the past 3 days.

## 2021-01-27 NOTE — ED Provider Notes (Signed)
[facility] Emergency Department Provider Note  ____________________________________________   Event Date/Time   First MD Initiated Contact with Patient 01/27/21 1141     (approximate)  I have reviewed the triage vital signs and the nursing notes.   HISTORY  Chief Complaint Altered Mental Status   HPI Andrew Bird is a 87 y.o. male with past medical history of CAD, HTN, BPH, arthritis, seizure disorder, CKD, dementia, and intracranial hemorrhage in 2006 who presents via EMS from clinic for evaluation of altered mental status.  History is very limited from the patient secondary to his dementia and altered mental status.  Per his wife who did arrive at bedside shortly after patient he is oriented to date or time had some memory difficulty secondary to dementia at baseline has seemed significantly more confused over the last 3 days has been speaking in gibberish putting on several layers of clothing when it is inappropriate to be set.  She thinks he passed out while laying in bed a couple of days ago but did not seek care until seeing his PCP in clinic today.  She denies that he has had any witnessed falls, cough, vomiting, diarrhea, dysuria or any other recent sick symptoms as far she can tell.  No clear precipitating events.  She does note that at baseline he is weak and has left leg has some weakness in his right leg and has to hold onto objects in the house to get around.         Past Medical History:  Diagnosis Date  . BPH (benign prostatic hyperplasia)   . Bradycardia   . Coronary artery disease   . Enlarged prostate   . Hypertension   . Renal disorder   . Senile dementia [facility])     Patient Active Problem List   Diagnosis Date Noted  . Acute metabolic encephalopathy 01/27/2021    Past Surgical History:  Procedure Laterality Date  . BURR HOLE FOR SUBDURAL HEMATOMA  11/22/2015  . CATARACT EXTRACTION, BILATERAL    . coronary artery stent    .  HERNIA REPAIR  2008    Prior to Admission medications   Medication Sig Start Date End Date Taking? Authorizing Provider  aspirin EC 81 MG tablet Take 81 mg by mouth daily. Patient not taking: Reported on 07/25/2020    [provider]  atorvastatin (LIPITOR) 40 MG tablet Take 40 mg by mouth daily.    [provider]  cholecalciferol (VITAMIN D) 400 UNITS TABS tablet Take 2,000 Units by mouth daily.    [provider]  clobetasol cream (TEMOVATE) 0.05 % Apply 1 application topically 2 (two) times daily.    [provider]  finasteride (PROSCAR) 5 MG tablet Take 5 mg by mouth daily.    [provider]  fluticasone Aleda Grana) 50 MCG/ACT nasal spray Frequency:PRN   Dosage:50   MCG  Instructions:  Note:Dose: 02/26/13   [provider]  gabapentin (NEURONTIN) 100 MG capsule Take by mouth. 06/08/20   [provider]  Lactobacillus (ACIDOPHILUS) CAPS capsule Take by mouth. 03/06/11   [provider]  levETIRAcetam (KEPPRA) 500 MG tablet Take by mouth. 12/09/16   [provider]  lisinopril (PRINIVIL,ZESTRIL) 10 MG tablet Take 10 mg by mouth daily.    [provider]  mometasone (ELOCON) 0.1 % ointment  03/04/20   [provider]  ranitidine (ZANTAC) 150 MG tablet Take 150 mg by mouth 2 (two) times daily.    [provider]  sertraline (ZOLOFT) 50 MG tablet Take by mouth. 07/21/20 07/21/21  [provider]  tamsulosin (FLOMAX) 0.4 MG CAPS capsule Take 0.4 mg by mouth daily.    [provider]  timolol (TIMOPTIC) 0.5 % ophthalmic solution Place 1 drop into both eyes daily.    [provider]    Allergies Aspirin  No family history on file.  Social History Social History   Tobacco Use  . Smoking status: Former Smoker  Substance Use Topics  . Alcohol use: No    Review of Systems  Review of Systems  Unable to perform ROS: Mental status change       ____________________________________________   PHYSICAL EXAM:  VITAL SIGNS: ED Triage Vitals  Enc Vitals Group     BP      Pulse      Resp      Temp      Temp src      SpO2      Weight      Height      Head Circumference      Peak Flow      Pain Score      Pain Loc      Pain Edu?      Excl. in GC?    Vitals:   01/27/21 1137 01/27/21 1300  BP: (!) 190/93 (!) 157/73  Pulse: 61 63  Resp: 18 19  Temp: 98.1 F (36.7 C)   SpO2: 98% 97%   Physical Exam Vitals and nursing note reviewed.  Constitutional:      Appearance: He is well-developed and well-nourished.  HENT:     Head: Normocephalic and atraumatic.     Right Ear: External ear normal.     Left Ear: External ear normal.     Nose: Nose normal.     Mouth/Throat:     Mouth: Mucous membranes are moist.  Eyes:     Conjunctiva/sclera: Conjunctivae normal.  Cardiovascular:     Rate and Rhythm: Normal rate and regular rhythm.     Pulses: Normal pulses.     Heart sounds: No murmur heard.   Pulmonary:     Effort: Pulmonary effort is normal. No respiratory distress.     Breath sounds: Normal breath sounds.  Abdominal:     Palpations: Abdomen is soft.     Tenderness: There is no abdominal tenderness.  Musculoskeletal:        General: No edema.     Cervical back: Neck supple.  Skin:    General: Skin is warm and dry.  Neurological:     Mental Status: He is alert. He is disoriented.  Psychiatric:        Mood and Affect: Mood and affect normal.     Cranial nerves II through XII grossly intact.  Patient has symmetric grip strength in his upper extremities.  He is barely able to lift his right heel off the bed.  Is able to lift his left leg off the bed.  Sensation is intact to light touch all extremities.  No obvious trauma to the face scalp head neck chest abdomen pelvis or extremities.  He was bilateral radial pulses.  He does not participate in further neurological testing including testing pronator drift or  finger dysmetria. ____________________________________________   LABS (all labs ordered are listed, but only abnormal results are displayed)  Labs Reviewed  CBC WITH DIFFERENTIAL/PLATELET - Abnormal; Notable for the following components:      Result Value  RBC 3.92 (*)    Platelets 143 (*)    All other components within normal limits  COMPREHENSIVE METABOLIC PANEL - Abnormal; Notable for the following components:   BUN 43 (*)    Creatinine, Ser 2.06 (*)    GFR, Estimated 30 (*)    All other components within normal limits  URINALYSIS, COMPLETE (UACMP) WITH MICROSCOPIC - Abnormal; Notable for the following components:   Color, Urine YELLOW (*)    APPearance CLEAR (*)    All other components within normal limits  SARS CORONAVIRUS 2 BY RT PCR (HOSPITAL ORDER, PERFORMED IN [facility])  TSH  AMMONIA   ____________________________________________  EKG  Sinus rhythm with a ventricular rate of 62, normal axis, unremarkable's the exception of PR interval is slightly prolonged and no clear evidence of acute ischemia. ____________________________________________  RADIOLOGY  ED MD interpretation: Chest x-ray shows no focal consolidation, thorax, effusion, overt edema, early acute thoracic process.  CT head shows cortical atrophy without evidence of any acute intracranial process including evidence of acute intracranial hemorrhage. Official radiology report(s): DG Chest 2 View  Result Date: 01/27/2021 CLINICAL DATA:  Syncope, altered mental status EXAM: CHEST - 2 VIEW COMPARISON:  01/27/2021 FINDINGS: Bilateral chronic interstitial thickening. No focal consolidation. No pleural effusion or pneumothorax. Heart and mediastinal contours are unremarkable. Severe osteoarthritis of the left glenohumeral joint. IMPRESSION: No active cardiopulmonary disease. Electronically Signed   By: Elige Ko   On: 01/27/2021 12:48   CT Head Wo Contrast  Result Date: 01/27/2021 CLINICAL DATA:   Altered mental status. EXAM: CT HEAD WITHOUT CONTRAST TECHNIQUE: Contiguous axial images were obtained from the base of the skull through the vertex without intravenous contrast. COMPARISON:  November 21, 2015. FINDINGS: Brain: Mild diffuse cortical atrophy is noted. No mass effect or midline shift is noted. Ventricular size is within normal limits. There is no evidence of mass lesion, hemorrhage or acute infarction. Vascular: No hyperdense vessel or unexpected calcification. Skull: Status post left craniotomy.  No acute abnormality is noted. Sinuses/Orbits: No acute finding. Other: None. IMPRESSION: Mild diffuse cortical atrophy. No acute intracranial abnormality seen. Electronically Signed   By: Lupita Raider M.D.   On: 01/27/2021 12:14    ____________________________________________   PROCEDURES  Procedure(s) performed (including Critical Care):  .1-3 Lead EKG Interpretation Performed by: Gilles Chiquito, MD Authorized by: Gilles Chiquito, MD     Interpretation: normal     ECG rate assessment: normal     Rhythm: sinus rhythm     Ectopy: none     Conduction: normal       ____________________________________________   INITIAL IMPRESSION / ASSESSMENT AND PLAN / ED COURSE      Patient presents with above to history exam accompanied by his wife with concerns for worsening mental status over the last couple of days and witnessed syncopal episode 3-4 days ago.  Patient is afebrile hemodynamically stable on arrival.  He is confused unable to participate in history and only minimally on exam.  Leg cellulitis unclear how acute this is as his wife states he has some significant weakness and difficulty ambulating at baseline.  While have not been any recent injuries or falls initial differential is quite broad and includes SAH, CVA, acute infectious process, metabolic derangements, liver failure, thyroid derangement versus delirium from other cause.  Polypharmacy could also be  contributing.  Low suspicion for toxic ingestion.  CT head shows no evidence of SAH or other clear acute cranial process.  Chest x-ray shows no evidence of an acute process.  ECG does not show evidence of acute ischemia.  CMP remarkable for evidence of AKI with elevated BUN and creatinine with a BUN of 43 and a creatinine of 2.06 compared to 1.285 years ago.  No other significant electrolyte or metabolic derangements.  No evidence of liver failure or cholestasis.  Ammonia is nonelevated and will suspicion for hepatic encephalopathy.  TSH is WNL number suspicion for acute thyroid derangement.  UA does not appear infected although patient was noted on bladder scan to have significant urine retention with greater than 450 cc of urine.  Foley catheter placed.  Plan to admit to medicine service with concerns for altered mental status secondary to likely uremia possibly contributing delirium from discomfort from acute urinary retention.  Unclear etiology at this time.      ____________________________________________   FINAL CLINICAL IMPRESSION(S) / ED DIAGNOSES  Final diagnoses:  Altered mental status, unspecified altered mental status type  AKI (acute kidney injury) (HCC)  Urinary retention    Medications  hydrALAZINE (APRESOLINE) injection 5 mg (has no administration in time range)  haloperidol lactate (HALDOL) injection 2 mg (2 mg Intravenous Given 01/27/21 1332)     ED Discharge Orders    None       Note:  This document was prepared using Dragon voice recognition software and may include unintentional dictation errors.   Gilles Chiquito, MD 01/27/21 [phone]

## 2021-01-27 NOTE — ED Notes (Addendum)
Pt repeatedly trying to get out of bed to use the restroom despite having a catheter.

## 2021-01-27 NOTE — H&P (Signed)
History and Physical    PLEASE NOTE THAT DRAGON DICTATION SOFTWARE WAS USED IN THE CONSTRUCTION OF THIS NOTE.   Andrew Bird VVO:[id number] DOB: [birth year] DOA: 01/27/2021  PCP: Dione Housekeeper, MD Patient coming from: home   I have personally briefly reviewed patient's old medical records in [facility]  Chief Complaint: Altered mental status  HPI: Andrew Bird is a 87 y.o. male with medical history significant for hypertension, hyperlipidemia, BPH, subdural hematoma requiring bur hole in November 2016, dementia, who is admitted to [facility] on 01/27/2021 with acute metabolic encephalopathy after presenting from home  to [facility] Emergency Department for evaluation of altered mental status.  In the setting of presenting acute encephalopathy superimposed on dementia, the following history is provided by the patient's wife as well as my discussions with the emergency department physician, and via chart review.  The patient reportedly has had straight evidence of worsening confusion relative to his status over the course of the last 2 days.  Wife also reports patient seems more agitated over that timeframe relative to his difficult temperament.  She does not believe that the patient has undergone any recent trauma, is not aware that he is experiencing recent nausea, vomiting, diarrhea.  Medical history is notable for history of BPH, for which he is prescribed finasteride.  And Flomax.  Additionally, he has history of subdural hematoma in November 2016 that required bur hole.  He is not currently on any blood thinning agents at home.     ED Course:  Vital signs in the ED were notable for the following: Temperature max 98.1; heart rate 61-76, blood pressure 127/73; respiratory rate 18-19; oxygen saturation 97 to 98% on room air.  Labs were notable for the following: CMP was notable for the following: Sodium 140, potassium 4.3, bicarbonate 23, anion gap 10,  BUN 43, creatinine 2.06 relative to most recent prior value of 1.28 when checked in November 2016.  CBC notable for white blood cell count of 6300.  Ammonia 23.  TSH 1.77.  Urinalysis was notable for no white blood cells, no bacteria, nitrate negative, leukocyte esterase negative, and showed no associated urinary casts.  Screening nasopharyngeal COVID-19 PCR performed in the ED today was found to be negative.    EKG shows sinus rhythm with right bundle branch block, heart rate 62, QRS 133, and no evidence of T wave or ST changes and likely no evidence of ST elevation.  Chest x-ray showed no evidence of acute cardiopulmonary process, including no evidence of infiltrate, edema, effusion, or pneumothorax.  Noncontrast CT of the head showed no evidence of acute intracranial process, including no evidence of intracranial hemorrhage, while showing diffuse cortical atrophy.   Postvoid residual scan showed greater than 450 cc of urine.   While in the ED, the patient was noted to be agitated, attempting to pull out IVs.  Consequently, he received Haldol 2 mg IV x1 initially.  He also received a 1 L lactated Ringer bolus.    Review of Systems: As per HPI otherwise 10 point review of systems negative.   Past Medical History:  Diagnosis Date  . BPH (benign prostatic hyperplasia)   . Bradycardia   . Coronary artery disease   . Enlarged prostate   . Hypertension   . Renal disorder   . Senile dementia [facility])     Past Surgical History:  Procedure Laterality Date  . BURR HOLE FOR SUBDURAL HEMATOMA  11/22/2015  . CATARACT EXTRACTION, BILATERAL    .  coronary artery stent    . HERNIA REPAIR  2008    Social History:  reports that he has quit smoking. He has never used smokeless tobacco. He reports that he does not drink alcohol. No history on file for drug use.   Allergies  Allergen Reactions  . Aspirin Other (See Comments)    Other reaction(s): Blood Disorder Intracranial bleed Intracranial  bleed     History reviewed. No pertinent family history.   Prior to Admission medications   Medication Sig Start Date End Date Taking? Authorizing Provider  aspirin EC 81 MG tablet Take 81 mg by mouth daily. Patient not taking: Reported on 07/25/2020    [provider]  atorvastatin (LIPITOR) 40 MG tablet Take 40 mg by mouth daily.    [provider]  cholecalciferol (VITAMIN D) 400 UNITS TABS tablet Take 2,000 Units by mouth daily.    [provider]  clobetasol cream (TEMOVATE) 0.05 % Apply 1 application topically 2 (two) times daily.    [provider]  finasteride (PROSCAR) 5 MG tablet Take 5 mg by mouth daily.    [provider]  fluticasone Aleda Grana(FLONASE) 50 MCG/ACT nasal spray Frequency:PRN   Dosage:50   MCG  Instructions:  Note:Dose: 50MCG 02/26/13   [provider]  gabapentin (NEURONTIN) 100 MG capsule Take by mouth. 06/08/20   [provider]  Lactobacillus (ACIDOPHILUS) CAPS capsule Take by mouth. 03/06/11   [provider]  levETIRAcetam (KEPPRA) 500 MG tablet Take by mouth. 12/09/16   [provider]  lisinopril (PRINIVIL,ZESTRIL) 10 MG tablet Take 10 mg by mouth daily.    [provider]  mometasone (ELOCON) 0.1 % ointment  03/04/20   [provider]  ranitidine (ZANTAC) 150 MG tablet Take 150 mg by mouth 2 (two) times daily.    [provider]  sertraline (ZOLOFT) 50 MG tablet Take by mouth. 07/21/20 07/21/21  [provider]  tamsulosin (FLOMAX) 0.4 MG CAPS capsule Take 0.4 mg by mouth daily.    [provider]  timolol (TIMOPTIC) 0.5 % ophthalmic solution Place 1 drop into both eyes daily.    [provider]     Objective    Physical Exam: Vitals:   01/27/21 1137 01/27/21 1300  BP: (!) 190/93 (!) 157/73  Pulse: 61 63  Resp: 18 19  Temp: 98.1 F (36.7 C)   TempSrc: Oral   SpO2: 98% 97%    General: appears to be stated age; alert,  confused, agitated.  Skin: warm, dry Head:  AT/[place] Mouth:  Oral mucosa membranes appear dry, normal dentition Neck: supple; trachea midline Heart:  RRR; did not appreciate any M/R/G Lungs: CTAB, did not appreciate any wheezes, rales, or rhonchi Abdomen: + BS; soft, ND, NT Vascular: 2+ pedal pulses b/l; 2+ radial pulses b/l Extremities: no peripheral edema, no muscle wasting Neuro: In the setting of patient's altered mental status and associated inability to follow instructions at this time, unable to perform full neurologic assessment.  Correspondingly, unable to perform full assessment of strength, sensation, or cranial nerves.  However, the patient noted to be spontaneously moving all 4 extremities.   Labs on Admission: I have personally reviewed following labs and imaging studies  CBC: Recent Labs  Lab 01/27/21 1145  WBC 6.3  NEUTROABS 4.0  HGB 13.1  HCT 39.0  MCV 99.5  PLT 143*   Basic Metabolic Panel: Recent Labs  Lab 01/27/21 1145  NA 140  K 4.3  CL 107  CO2  23  GLUCOSE 99  BUN 43*  CREATININE 2.06*  CALCIUM 9.1   GFR: CrCl cannot be calculated (Unknown ideal weight.). Liver Function Tests: Recent Labs  Lab 01/27/21 1145  AST 20  ALT 16  ALKPHOS 58  BILITOT 1.1  PROT 6.6  ALBUMIN 3.9   No results for input(s): LIPASE, AMYLASE in the last 168 hours. Recent Labs  Lab 01/27/21 1152  AMMONIA 23   Coagulation Profile: No results for input(s): INR, PROTIME in the last 168 hours. Cardiac Enzymes: No results for input(s): CKTOTAL, CKMB, CKMBINDEX, TROPONINI in the last 168 hours. BNP (last 3 results) No results for input(s): PROBNP in the last 8760 hours. HbA1C: No results for input(s): HGBA1C in the last 72 hours. CBG: No results for input(s): GLUCAP in the last 168 hours. Lipid Profile: No results for input(s): CHOL, HDL, LDLCALC, TRIG, CHOLHDL, LDLDIRECT in the last 72 hours. Thyroid Function Tests: Recent Labs    01/27/21 1145  TSH 1.722    Anemia Panel: No results for input(s): VITAMINB12, FOLATE, FERRITIN, TIBC, IRON, RETICCTPCT in the last 72 hours. Urine analysis:    Component Value Date/Time   COLORURINE YELLOW (A) 01/27/2021 1309   APPEARANCEUR CLEAR (A) 01/27/2021 1309   LABSPEC 1.018 01/27/2021 1309   PHURINE 6.0 01/27/2021 1309   GLUCOSEU NEGATIVE 01/27/2021 1309   HGBUR NEGATIVE 01/27/2021 1309   BILIRUBINUR NEGATIVE 01/27/2021 1309   KETONESUR NEGATIVE 01/27/2021 1309   PROTEINUR NEGATIVE 01/27/2021 1309   NITRITE NEGATIVE 01/27/2021 1309   LEUKOCYTESUR NEGATIVE 01/27/2021 1309    Radiological Exams on Admission: DG Chest 2 View  Result Date: 01/27/2021 CLINICAL DATA:  Syncope, altered mental status EXAM: CHEST - 2 VIEW COMPARISON:  01/27/2021 FINDINGS: Bilateral chronic interstitial thickening. No focal consolidation. No pleural effusion or pneumothorax. Heart and mediastinal contours are unremarkable. Severe osteoarthritis of the left glenohumeral joint. IMPRESSION: No active cardiopulmonary disease. Electronically Signed   By: Elige Ko   On: 01/27/2021 12:48   CT Head Wo Contrast  Result Date: 01/27/2021 CLINICAL DATA:  Altered mental status. EXAM: CT HEAD WITHOUT CONTRAST TECHNIQUE: Contiguous axial images were obtained from the base of the skull through the vertex without intravenous contrast. COMPARISON:  November 21, 2015. FINDINGS: Brain: Mild diffuse cortical atrophy is noted. No mass effect or midline shift is noted. Ventricular size is within normal limits. There is no evidence of mass lesion, hemorrhage or acute infarction. Vascular: No hyperdense vessel or unexpected calcification. Skull: Status post left craniotomy.  No acute abnormality is noted. Sinuses/Orbits: No acute finding. Other: None. IMPRESSION: Mild diffuse cortical atrophy. No acute intracranial abnormality seen. Electronically Signed   By: Lupita Raider M.D.   On: 01/27/2021 12:14     EKG: Independently reviewed, with result as  described above.    Assessment/Plan   TADAO EMIG is a 87 y.o. male with medical history significant for hypertension, hyperlipidemia, BPH, subdural hematoma requiring bur hole in November 2016, dementia, who is admitted to [facility] on 01/27/2021 with acute metabolic encephalopathy after presenting from home  to [facility] Emergency Department for evaluation of altered mental status.   Principal Problem:   Acute metabolic encephalopathy Active Problems:   AKI (acute kidney injury) (HCC)   Urinary retention   BPH (benign prostatic hyperplasia)   Hypertension    #) Acute metabolic encephalopathy: Diagnosis on the basis of with report of patient showing evidence of 2 days of progressive confusion relative to his baseline mental status, which is  associated with dementia.  Eventually multifactorial in nature, with suspected contributions from acute kidney injury with associated noted urinary retention for post void residual in the ED, as well as resultant decline in clearance of certain central acting outpatient medications, including that of gabapentin, which is 100% renally cleared.  No evidence of underlying infectious process at this time, as urinalysis is inconsistent with urinary tract infection, while nasopharyngeal COVID-19 PCR performed in the ED today was found to be positive.  Additionally, chest x-ray shows no evidence of acute cardiopulmonary process, including no evidence of pneumonia.  Of note, TSH found to be within normal limits when checked in the ED today.  We will keep n.p.o. until the patient is able to pass nursing bedside swallow screen.  Well his current mental status rivets full neurologic assessment, he appears to be moving all 4 extremities, thereby reducing the likelihood of presenting acute ischemic CVA, while noncontrast CT head performed today showed no evidence of acute intracranial process, which was notable in the context of a history of subdural  hematoma in 2016.    Plan: NPO. Nursing bedside swallow evaluation prior to the initiation of a diet/oral medications, as described above. CMP in the morning. Repeat CBC in the morning. check VBG to evaluate for any contribution from hypercapnic encephalopathy.  check B12.  Work-up and management of presenting acute kidney injury, as above.  Even if the patient does passes nursing bedside swallow screen, will continue to hold gabapentin until improvement in renal function.  Have also placed an inpatient pharmacy consult order accurate reconciliation of patient's home medication list, including that of Keppra.       #) Acute kidney injury: Presenting elevated creatinine 2.06 is presumed to represent an acute kidney injury, although evaluation of the chronicity of this elevation is complicated by the absence of any interval creatinine data points in our EMR since November 2016, at which time creatinine was noted to be 1.28.  We will assume that this is an acute kidney injury for now, potentially on the basis of post renal contribution from urinary retention, as evidenced by post void residual of greater than 450 cc in the ED today.  This is in the context of a documented history of BPH, as further described below.  Additionally, there is likely an additional contribution to AKI from a prerenal standpoint, in the context of intravascular depletion stemming from the patient's wife's report of decline in oral intake over the last 2 days.  Also, potential pharmacologic contribution missing from lisinopril.  Presenting urinalysis showed no evidence of urinary casts.  Plan: Monitor strict daily weights.  Avoid nephrotoxic agents.  Continue to hold home lisinopril and gabapentin.  Gentle IV fluids in the form of lactated Ringer's at 50 cc/h x 12 hours.  Add on random urine sodium as well as random urine creatinine.  Repeat BMP in the morning.  Work-up and management of urinary retention, as further described  below.     #) Urinary retention: In the context of a documented history of BPH, postvoid residual in the ED today was found to be greater than 450 cc prompting an I&O cath x 1.  The quantity of patient's residual urine is unclear, although I suspect that presenting value of 450 cc may represent interval worsening relative to baseline in the setting of suspected acute kidney injury, as above.  Will refrain from placement of indwelling Foley catheter for now, particularly given the patient's current agitated state and concern that he may  attempt to remove Foley catheter while balloon is inflated.  Rather, I have ordered post void residual scans scheduled every 6 hours with as needed straight cath for postvoid residual greater than 400 cc's.  In addition to the mechanical contributions from documented history of BPH, there may also be anticholinergic contributions to his urinary retention, including home medication list that includes Zoloft.   Plan: Scheduled postvoid residuals every 6 hours with as needed straight cath for postvoid residual greater than 400 cc.  Hold home gentle IV fluids overnight, as above.  Repeat BMP in the morning.  We will continue home finasteride and Flomax.  Will attempt additional chart review to see if the patient has any established outpatient urology notes.      #) Essential hypertension: Outpatient hypertensive regimen includes lisinopril, with additional potential antihypertensive implications from Flomax.  Systolic blood pressures noted to be in the 150s in the ED.  Plan: We will hold home lisinopril for now in the setting of both AKI as well as current n.p.o. status.  Close monitoring of ensuing blood pressure via routine vital signs.      #) Hyperlipidemia: On atorvastatin as an outpatient.  Plan: Hold statin for now in the setting of current n.p.o. status.     DVT prophylaxis: SCDs  Code Status: Full code Disposition Plan: Per Rounding Team Consults  called: none  Admission status: Inpatient;   Of note, this patient was added by me to the following Admit List/Treatment Team:  armcadmits     PLEASE NOTE THAT DRAGON DICTATION SOFTWARE WAS USED IN THE CONSTRUCTION OF THIS NOTE.   Angie Fava DO [facility] Pager [phone] From 12PM- 12AM  Otherwise, please contact night-coverage  [email or web address] Password [facility]  01/27/2021, 4:34 PM

## 2021-01-27 NOTE — ED Notes (Addendum)
Pt pulled off card monitor leads. Attempted to replace them, but pt not allowing to do so. Pt continues to pull on restraints and attempting to get up from bed holding on to rails. This Clinical research associate and Ian Malkin, EDT remain at bedside.

## 2021-01-27 NOTE — ED Notes (Signed)
Pt attempting to get out of bed. CNA sitter at bedside for safety

## 2021-01-27 NOTE — ED Triage Notes (Addendum)
Sent from PCP. Wife reports increasing AMS x 3 days. Hx dementia

## 2021-01-27 NOTE — ED Notes (Signed)
This tech as 1:1 sitter at this time; introduced self to pt.

## 2021-01-27 NOTE — ED Notes (Addendum)
Immediately after administering medication pt attempted to get out of bed. This RN and NT attempted to redirect pt to bed when patient became violent, throwing punches, kicking, attempting to bite staff. Multiple staff to bedside. Admit MD notified

## 2021-01-27 NOTE — ED Notes (Signed)
Pt repeatedly pulling at restraints; attempted to get out of them. Pt was able to undo left wrist restraint; Zach EDT and this writer able to readjust restraint. Reed, RN notified.

## 2021-01-28 ENCOUNTER — Other Ambulatory Visit: Payer: Self-pay | Admitting: Urology

## 2021-01-28 DIAGNOSIS — N400 Enlarged prostate without lower urinary tract symptoms: Secondary | ICD-10-CM | POA: Diagnosis present

## 2021-01-28 DIAGNOSIS — R339 Retention of urine, unspecified: Secondary | ICD-10-CM | POA: Diagnosis present

## 2021-01-28 DIAGNOSIS — I10 Essential (primary) hypertension: Secondary | ICD-10-CM | POA: Diagnosis present

## 2021-01-28 DIAGNOSIS — N179 Acute kidney failure, unspecified: Secondary | ICD-10-CM | POA: Diagnosis present

## 2021-01-28 DIAGNOSIS — N1832 Chronic kidney disease, stage 3b: Secondary | ICD-10-CM | POA: Diagnosis present

## 2021-01-28 LAB — COMPREHENSIVE METABOLIC PANEL
ALT: 17 U/L (ref 0–44)
AST: 25 U/L (ref 15–41)
Albumin: 3.8 g/dL (ref 3.5–5.0)
Alkaline Phosphatase: 61 U/L (ref 38–126)
Anion gap: 10 (ref 5–15)
BUN: 38 mg/dL — ABNORMAL HIGH (ref 8–23)
CO2: 23 mmol/L (ref 22–32)
Calcium: 9.4 mg/dL (ref 8.9–10.3)
Chloride: 106 mmol/L (ref 98–111)
Creatinine, Ser: 1.83 mg/dL — ABNORMAL HIGH (ref 0.61–1.24)
GFR, Estimated: 35 mL/min — ABNORMAL LOW (ref >60)
Glucose, Bld: 84 mg/dL (ref 70–99)
Potassium: 4.4 mmol/L (ref 3.5–5.1)
Sodium: 139 mmol/L (ref 135–145)
Total Bilirubin: 1.5 mg/dL — ABNORMAL HIGH (ref 0.3–1.2)
Total Protein: 6.2 g/dL — ABNORMAL LOW (ref 6.5–8.1)

## 2021-01-28 LAB — CBC
HCT: 40.6 % (ref 39.0–52.0)
Hemoglobin: 13.7 g/dL (ref 13.0–17.0)
MCH: 33.1 pg (ref 26.0–34.0)
MCHC: 33.7 g/dL (ref 30.0–36.0)
MCV: 98.1 fL (ref 80.0–100.0)
Platelets: 134 10*3/uL — ABNORMAL LOW (ref 150–400)
RBC: 4.14 MIL/uL — ABNORMAL LOW (ref 4.22–5.81)
RDW: 12.7 % (ref 11.5–15.5)
WBC: 7.6 10*3/uL (ref 4.0–10.5)
nRBC: 0 % (ref 0.0–0.2)

## 2021-01-28 LAB — MAGNESIUM: Magnesium: 1.8 mg/dL (ref 1.7–2.4)

## 2021-01-28 MED ORDER — CHLORHEXIDINE GLUCONATE CLOTH 2 % EX PADS
6.0000 | MEDICATED_PAD | Freq: Every day | CUTANEOUS | Status: DC
  Administered 2021-01-28 – 2021-02-02 (×6): 6 via TOPICAL

## 2021-01-28 MED ORDER — TAMSULOSIN HCL 0.4 MG PO CAPS
0.4000 mg | ORAL_CAPSULE | Freq: Every day | ORAL | Status: DC
  Administered 2021-01-29 – 2021-02-01 (×4): 0.4 mg via ORAL
  Filled 2021-01-28 (×4): qty 1

## 2021-01-28 MED ORDER — FINASTERIDE 5 MG PO TABS
5.0000 mg | ORAL_TABLET | Freq: Every day | ORAL | Status: DC
  Administered 2021-01-29 – 2021-02-02 (×5): 5 mg via ORAL
  Filled 2021-01-28 (×5): qty 1

## 2021-01-28 MED ORDER — DEXTROSE-NACL 5-0.45 % IV SOLN: INTRAVENOUS | Status: DC

## 2021-01-28 MED ORDER — TIMOLOL MALEATE 0.5 % OP SOLN
1.0000 [drp] | Freq: Every day | OPHTHALMIC | Status: DC
  Administered 2021-01-29 – 2021-02-02 (×5): 1 [drp] via OPHTHALMIC
  Filled 2021-01-28: qty 5

## 2021-01-28 NOTE — Consult Note (Signed)
Urology consult  Requesting physician: Lynn Ito, MD  Reason for consultation: Urinary retention  HPI: Andrew Bird is a 87 y.o. male with a history of dementia and BPH admitted to [facility] 01/27/2021 with acute metabolic encephalopathy. No family was present when the patient was seen.   Urology consultation requested by wife as he is followed as an outpatient  Review of medical records negative for any recent urologic evaluation. I last saw the patient at [facility] in 2016  Long history of BPH on tamsulosin and finasteride  Bladder scan PVR in the ED was >450 mL  Initial concern of Foley catheter drainage due to the possibility of self removal of the Foley and initially ordered in and out cath/bladder scan although Foley subsequently placed  Initial creatinine 2.06 with baseline 1.5-1.9  ROS: Not obtainable  Exam: General -patient sleeping GU-Foley catheter draining clear urine  Impression/recommendation:  1. BPH with urinary retention  Would keep indwelling Foley x7 days  Continue tamsulosin/finasteride  I have not seen the patient since 2016 and if he is seeing another urologist who his records are not reviewable through Epic would have him follow-up with his primary urologist  Otherwise will be happy to see him in office for catheter removal/voiding trial and further evaluation if needed   Irineo Axon, MD

## 2021-01-28 NOTE — Progress Notes (Signed)
Bilateral wrist restraints removed upon arrival and mittens applied. Wife and sitter at bedside. Haldol given x1 this shift for agitation. Patient resting at this time.

## 2021-01-28 NOTE — Progress Notes (Signed)
PROGRESS NOTE    Andrew Bird  LEX:[id number] DOB: 1928/04/08 DOA: 01/27/2021 PCP: Dione Housekeeper, MD    Brief Narrative:  Andrew Bird is a 87 y.o. male with medical history significant for hypertension, hyperlipidemia, BPH, subdural hematoma requiring bur hole in November 2016, dementia, who is admitted to [facility] on 01/27/2021 with acute metabolic encephalopathy after presenting from home  to [facility] Emergency Department for evaluation of altered mental status.Noncontrast CT of the head showed no evidence of acute intracranial process, including no evidence of intracranial hemorrhage, while showing diffuse cortical atrophy. Postvoid residual scan showed greater than 450 cc of urine.  While in the ED, the patient was noted to be agitated, attempting to pull out IVs.  Consequently, he received Haldol 2 mg IV x1 initially.  He also received a 1 L lactated Ringer bolus.  2/5-wife at bedside.  Patient pleasantly confused.  Wife requested neurology to see patient as patient follows urology as outpatient   Consultants:     Procedures:   Antimicrobials:       Subjective: Patient has no complaints, confused  Objective: Vitals:   01/27/21 2325 01/28/21 0405 01/28/21 0433 01/28/21 0812  BP: (!) 169/84 (!) 163/86  (!) 165/78  Pulse: 60 63  61  Resp: 20 18  16   Temp: 97.7 F (36.5 C)   98 F (36.7 C)  TempSrc: Oral   Oral  SpO2: 96% 97%  99%  Weight:   70.6 kg   Height:        Intake/Output Summary (Last 24 hours) at 01/28/2021 0827 Last data filed at 01/28/2021 0657 Gross per 24 hour  Intake 426.23 ml  Output 1550 ml  Net -1123.77 ml   Filed Weights   01/27/21 2159 01/28/21 0433  Weight: 71.8 kg 70.6 kg    Examination:  General exam: Appears calm and comfortable, confused Respiratory system: Clear to auscultation. Respiratory effort normal. Cardiovascular system: S1 & S2 heard, RRR. No JVD, murmurs, rubs, gallops or  clicks. Gastrointestinal system: Abdomen is nondistended, soft and nontender.. Normal bowel sounds heard. Foley in place with yellow urine Central nervous system: confused , grossly intact Extremities:no edema Psychiatry: Mood & affect appropriate in current setting.     Data Reviewed: I have personally reviewed following labs and imaging studies  CBC: Recent Labs  Lab 01/27/21 1145  WBC 6.3  NEUTROABS 4.0  HGB 13.1  HCT 39.0  MCV 99.5  PLT 143*   Basic Metabolic Panel: Recent Labs  Lab 01/27/21 1145  NA 140  K 4.3  CL 107  CO2 23  GLUCOSE 99  BUN 43*  CREATININE 2.06*  CALCIUM 9.1   GFR: Estimated Creatinine Clearance: 23.8 mL/min (A) (by C-G formula based on SCr of 2.06 mg/dL (H)). Liver Function Tests: Recent Labs  Lab 01/27/21 1145  AST 20  ALT 16  ALKPHOS 58  BILITOT 1.1  PROT 6.6  ALBUMIN 3.9   No results for input(s): LIPASE, AMYLASE in the last 168 hours. Recent Labs  Lab 01/27/21 1152  AMMONIA 23   Coagulation Profile: No results for input(s): INR, PROTIME in the last 168 hours. Cardiac Enzymes: No results for input(s): CKTOTAL, CKMB, CKMBINDEX, TROPONINI in the last 168 hours. BNP (last 3 results) No results for input(s): PROBNP in the last 8760 hours. HbA1C: No results for input(s): HGBA1C in the last 72 hours. CBG: No results for input(s): GLUCAP in the last 168 hours. Lipid Profile: No results for input(s): CHOL, HDL,  LDLCALC, TRIG, CHOLHDL, LDLDIRECT in the last 72 hours. Thyroid Function Tests: Recent Labs    01/27/21 1145  TSH 1.722   Anemia Panel: No results for input(s): VITAMINB12, FOLATE, FERRITIN, TIBC, IRON, RETICCTPCT in the last 72 hours. Sepsis Labs: No results for input(s): PROCALCITON, LATICACIDVEN in the last 168 hours.  Recent Results (from the past 240 hour(s))  SARS Coronavirus 2 by RT PCR (hospital order, performed in [facility] hospital lab) Nasopharyngeal Nasopharyngeal Swab     Status: None   Collection  Time: 01/27/21 11:51 AM   Specimen: Nasopharyngeal Swab  Result Value Ref Range Status   SARS Coronavirus 2 NEGATIVE NEGATIVE Final    Comment: (NOTE) SARS-CoV-2 target nucleic acids are NOT DETECTED.  The SARS-CoV-2 RNA is generally detectable in upper and lower respiratory specimens during the acute phase of infection. The lowest concentration of SARS-CoV-2 viral copies this assay can detect is 250 copies / mL. A negative result does not preclude SARS-CoV-2 infection and should not be used as the sole basis for treatment or other patient management decisions.  A negative result may occur with improper specimen collection / handling, submission of specimen other than nasopharyngeal swab, presence of viral mutation(s) within the areas targeted by this assay, and inadequate number of viral copies (<250 copies / mL). A negative result must be combined with clinical observations, patient history, and epidemiological information.  Fact Sheet for Patients:   BoilerBrush.com.cy  Fact Sheet for Healthcare Providers: https://pope.com/  This test is not yet approved or  cleared by the [place] FDA and has been authorized for detection and/or diagnosis of SARS-CoV-2 by FDA under an Emergency Use Authorization (EUA).  This EUA will remain in effect (meaning this test can be used) for the duration of the COVID-19 declaration under Section 564(b)(1) of the Act, 21 U.S.C. section 360bbb-3(b)(1), unless the authorization is terminated or revoked sooner.  Performed at [facility], [address] [place], [place] [address]          Radiology Studies: DG Chest 2 View  Result Date: 01/27/2021 CLINICAL DATA:  Syncope, altered mental status EXAM: CHEST - 2 VIEW COMPARISON:  01/27/2021 FINDINGS: Bilateral chronic interstitial thickening. No focal consolidation. No pleural effusion or pneumothorax. Heart and mediastinal contours are  unremarkable. Severe osteoarthritis of the left glenohumeral joint. IMPRESSION: No active cardiopulmonary disease. Electronically Signed   By: Elige Ko   On: 01/27/2021 12:48   CT Head Wo Contrast  Result Date: 01/27/2021 CLINICAL DATA:  Altered mental status. EXAM: CT HEAD WITHOUT CONTRAST TECHNIQUE: Contiguous axial images were obtained from the base of the skull through the vertex without intravenous contrast. COMPARISON:  November 21, 2015. FINDINGS: Brain: Mild diffuse cortical atrophy is noted. No mass effect or midline shift is noted. Ventricular size is within normal limits. There is no evidence of mass lesion, hemorrhage or acute infarction. Vascular: No hyperdense vessel or unexpected calcification. Skull: Status post left craniotomy.  No acute abnormality is noted. Sinuses/Orbits: No acute finding. Other: None. IMPRESSION: Mild diffuse cortical atrophy. No acute intracranial abnormality seen. Electronically Signed   By: Lupita Raider M.D.   On: 01/27/2021 12:14        Scheduled Meds: . Chlorhexidine Gluconate Cloth  6 each Topical Q0600  . hydrALAZINE  5 mg Intravenous Once   Continuous Infusions: . lactated ringers 50 mL/hr at 01/28/21 4196    Assessment & Plan:   Principal Problem:   Acute metabolic encephalopathy Active Problems:   AKI (  acute kidney injury) (HCC)   Urinary retention   BPH (benign prostatic hyperplasia)   Hypertension   Andrew Bird is a 87 y.o. male with medical history significant for hypertension, hyperlipidemia, BPH, subdural hematoma requiring bur hole in November 2016, dementia, who is admitted to [facility] on 01/27/2021 with acute metabolic encephalopathy after presenting from home  to [facility] Emergency Department for evaluation of altered mental status.  #) Acute metabolic encephalopathy: likely secondary to urinary retention and AKI in the setting of underlying baseline dementia.  Also possibly medication induced  including gabapentin teen.   Negative for acute abnormality Ck swallowing evalve Npo for now        #) Acute kidney injury superimposed on CKD stage IIIa: Presented with a creatinine of 2.06  Had post void residual greater than 450 cc in ED  Baseline 1.5-1.9 Has history of BPH  Foley in place  10 mildly decreased at 1.83 today Was given IV fluids We will continue with gentle hydration Avoid nephrotoxic medication      #) Urinary retention: In the context of a documented history of BPH, with elevated postvoid residual in the ED Keep foley in Resume flomax and proscar Urology consulted as pt's wife requesting , he follows with urology as outpt       #) Essential hypertension: Currently stable. Monitor for now ACEI on hold due to AKI        #) Hyperlipidemia: On atorvastatin as an outpatient. holding since npo    DVT prophylaxis: scd Code Status:full  Family Communication: wife at bedside-d/w wife about code status, she will d/w her family about changing to dnr v.s.full. for now he will full.   Status is: Inpatient  Remains inpatient appropriate because:Inpatient level of care appropriate due to severity of illness   Dispo: The patient is from: Home              Anticipated d/c is to: TBD              Anticipated d/c date is: 2 days              Patient currently is not medically stable to d/c.   Difficult to place patient No               LOS: 1 day   Time spent: 35 min with >50% on coc    Lynn Ito, MD [facility] Pager [phone]  If 7PM-7AM, please contact night-coverage 01/28/2021, 8:27 AM

## 2021-01-29 LAB — BASIC METABOLIC PANEL
Anion gap: 10 (ref 5–15)
BUN: 39 mg/dL — ABNORMAL HIGH (ref 8–23)
CO2: 20 mmol/L — ABNORMAL LOW (ref 22–32)
Calcium: 9.1 mg/dL (ref 8.9–10.3)
Chloride: 105 mmol/L (ref 98–111)
Creatinine, Ser: 1.96 mg/dL — ABNORMAL HIGH (ref 0.61–1.24)
GFR, Estimated: 32 mL/min — ABNORMAL LOW (ref >60)
Glucose, Bld: 121 mg/dL — ABNORMAL HIGH (ref 70–99)
Potassium: 4 mmol/L (ref 3.5–5.1)
Sodium: 135 mmol/L (ref 135–145)

## 2021-01-29 MED ORDER — AMLODIPINE BESYLATE 5 MG PO TABS
5.0000 mg | ORAL_TABLET | Freq: Every day | ORAL | Status: DC
Start: 2021-01-29 — End: 2021-01-30
  Administered 2021-01-29 – 2021-01-30 (×2): 5 mg via ORAL
  Filled 2021-01-29 (×2): qty 1

## 2021-01-29 NOTE — Progress Notes (Signed)
MD notified RN for bedside swallow eval. Speech unavailable. Patient HOB in sitting position.  Patient able to state name but otherwise confused but cooperative.  Patient able to drink water with a straw without coughing.  Tolerated well.  Patient able to swallow applesauce without coughing and tolerated well.

## 2021-01-29 NOTE — Progress Notes (Signed)
PT Cancellation Note  Patient Details Name: Andrew Bird MRN: [id number] DOB: [birth year]   Cancelled Treatment:    Reason Eval/Treat Not Completed: Patient not medically ready PT orders received, chart reviewed. Pt noted to have elevated BP (last 2 readings: 186/100 mmHg, 171/89 mmHg) with MD recommending to hold PT at this time. Will f/u as pt is medically stable.  Aleda Grana, PT, DPT 01/29/21, 1:35 PM    Sandi Mariscal 01/29/2021, 1:34 PM

## 2021-01-29 NOTE — Progress Notes (Signed)
OT Cancellation Note  Patient Details Name: Andrew Bird MRN: [id number] DOB: 08-21-1928   Cancelled Treatment:    Reason Eval/Treat Not Completed: Medical issues which prohibited therapy. Orders received and chart reviewed. Pt noted to have elevated BP (last 2 readings: 186/100 mmHg, 171/89 mmHg) with MD recommending to hold therapy at this time. Will follow up at later date/time once pt is medically appropriate.   Matthew Folks, OTR/L [facility] [phone]

## 2021-01-29 NOTE — Evaluation (Signed)
Clinical/Bedside Swallow Evaluation Patient Details  Name: Andrew Bird MRN: [id number] Date of Birth: December 13, 1928  Today's Date: 01/29/2021 Time: SLP Start Time (ACUTE ONLY): 1130 SLP Stop Time (ACUTE ONLY): 1215 SLP Time Calculation (min) (ACUTE ONLY): 45 min  Past Medical History:  Past Medical History:  Diagnosis Date  . BPH (benign prostatic hyperplasia)   . Bradycardia   . Coronary artery disease   . Enlarged prostate   . Hypertension   . Renal disorder   . Senile dementia [facility])    Past Surgical History:  Past Surgical History:  Procedure Laterality Date  . BURR HOLE FOR SUBDURAL HEMATOMA  11/22/2015  . CATARACT EXTRACTION, BILATERAL    . coronary artery stent    . HERNIA REPAIR  2008   HPI:  Pt  is a 87 y.o. male with past medical history of Dementia, CAD, HTN, BPH, arthritis, seizure disorder, CKD, and intracranial hemorrhage in 2006 who presents via EMS from clinic for evaluation of altered mental status.  History is very limited from the patient secondary to his dementia and altered mental status.  Per his wife, pt has memory difficulty secondary to Dementia at baseline but seemed significantly more confused over the last 3 days has been speaking in gibberish putting on several layers of clothing when was inappropriate. He layed around in bed a couple of days but did not seek care until seeing his PCP in clinic the day of admit.  She denies that he has had any witnessed falls, cough, vomiting, diarrhea, dysuria or any other recent sick symptoms as far she can tell.  No clear precipitating events.  She does note that at baseline he is weak and has left leg has some weakness in his right leg and has to hold onto objects in the house to get around; feeds self w/ both hands holding the Cup.   Chest x-ray showed No evidence of acute cardiopulmonary process, including no evidence of infiltrate, edema, effusion, or pneumothorax.  Noncontrast CT of the head showed No evidence of acute  intracranial process, including no evidence of intracranial hemorrhage, while showing diffuse cortical atrophy.  Admitting dx: Acute metabolic encephalopathy w/ AKI and urinary retention.   Assessment / Plan / Recommendation Clinical Impression  Pt appears to present w/ Mild oral phase dysphagia c/b min lengthier oral phase time for bolus management and mastication/oral clearing of increased textured foods -- pt tends to eat broken-down, soft/moist foods at home per Wife which "he likes". Suspect oral phase presentation is directly impacted by his Baseline Dementia, Cognitive decline. This presentation can in pharyngeal phase dysphagia in light of declined Cognitive status; Dementia. This impacts his overall awareness/engagement and safety during po tasks which increases risk for aspiration, choking. Pt's risk for aspiration, as well as concern for meeting nutritional needs fully, is present, but they can be reduced when following general aspiration precautions and given feeding assistance w/ modified foods easy for eating/intake. He requires Min verbal/ visual cues for orientation to bolus presentation, and during feeding support, but he was able to feed himself w/ support. Pt consumed trials of ice chip, thin liquids Via Cup, purees, and broken down solids, moistened w/ No immediate, overt clinical s/s of aspiration noted; no decline in vocal quality during few verbalizations, no cough, and no decline in respiratory status or O2 sats during/post trials. Oral phase was grossly adequate for bolus management and oral clearing of the boluses given. Min increased Time needed for mastication of solids though cut small.  OM Exam was appeared [facility] w/ No unilateral weakness noted.  D/t pt's Baseline Dementia, his risk for aspiration, recommend initiation of the dysphagia level 3(mech soft diet w/ minced meats, moistened foods) w/ Thin liquids Via Cup, aspiration precautions; reduce Distractions during meals and support  pt w/ Feeding as needed - but pt to [employer]. Pills Crushed in Puree for safer swallowing. Support w/ feeding at meals. MD/NSG updated. Recommend Dietician f/u for nutritional support - pt drinks Boost at home per Wife. ST services can be available for further new needs while admitted. Precautions posted in room. SLP Visit Diagnosis: Dysphagia, oral phase (R13.11) (min)    Aspiration Risk  Mild aspiration risk;Risk for inadequate nutrition/hydration (reduced following general precautions)    Diet Recommendation  Mech Soft diet w/ Meats Minced, Gravies to moisten foods; Thin liquids VIA CUP -- No Straws.  General aspiration precautions.  Feeding support at meals; Prep.  Reduce distractions, talking at meals.   Medication Administration: Whole meds with puree (vs need to Crush in puree)    Other  Recommendations Recommended Consults:  (Dietician f/u; Palliative Care f/u for GOC as needed) Oral Care Recommendations: Oral care BID;Oral care before and after PO;Staff/trained caregiver to provide oral care (Dentures) Other Recommendations:  (n/a)   Follow up Recommendations None      Frequency and Duration  (n/a)   (n/a)       Prognosis Prognosis for Safe Diet Advancement: Fair Barriers to Reach Goals: Cognitive deficits;Time post onset;Severity of deficits      Swallow Study   General Date of Onset: 01/27/21 HPI: Pt  is a 87 y.o. male with past medical history of Dementia, CAD, HTN, BPH, arthritis, seizure disorder, CKD, and intracranial hemorrhage in 2006 who presents via EMS from clinic for evaluation of altered mental status.  History is very limited from the patient secondary to his dementia and altered mental status.  Per his wife, pt has memory difficulty secondary to Dementia at baseline but seemed significantly more confused over the last 3 days has been speaking in gibberish putting on several layers of clothing when was inappropriate. He layed around in bed a couple of days but  did not seek care until seeing his PCP in clinic the day of admit.  She denies that he has had any witnessed falls, cough, vomiting, diarrhea, dysuria or any other recent sick symptoms as far she can tell.  No clear precipitating events.  She does note that at baseline he is weak and has left leg has some weakness in his right leg and has to hold onto objects in the house to get around; feeds self w/ both hands holding the Cup.   Chest x-ray showed No evidence of acute cardiopulmonary process, including no evidence of infiltrate, edema, effusion, or pneumothorax.  Noncontrast CT of the head showed No evidence of acute intracranial process, including no evidence of intracranial hemorrhage, while showing diffuse cortical atrophy.  Admitting dx: Acute metabolic encephalopathy w/ AKI and urinary retention. Type of Study: Bedside Swallow Evaluation Previous Swallow Assessment: none Diet Prior to this Study: NPO (soft foods at home per Wife) Temperature Spikes Noted: No (wbc 7.6) Respiratory Status: Room air History of Recent Intubation: No Behavior/Cognition: Alert;Cooperative;Pleasant mood;Confused;Distractible;Requires cueing (baseline Dementia) Oral Cavity Assessment: Within Functional Limits Oral Care Completed by SLP: Yes Oral Cavity - Dentition: Dentures, top;Dentures, bottom Vision: Functional for self-feeding Self-Feeding Abilities: Able to feed self;Needs assist;Needs set up;Total assist (able to hold cup to drink) Patient Positioning:  Upright in bed (needed full positioning) Baseline Vocal Quality: Low vocal intensity (mumbled speech; smiling) Volitional Cough: Strong (fair) Volitional Swallow: Unable to elicit    Oral/Motor/Sensory Function Overall Oral Motor/Sensory Function: Within functional limits (basic lingula movements)   Ice Chips Ice chips: Within functional limits Presentation: Spoon (fed; 3 trials)   Thin Liquid Thin Liquid: Within functional limits Presentation: Cup;Self Fed  (9-10 trials) Other Comments: slowly, single sips w/ pt hold Cup    Nectar Thick Nectar Thick Liquid: Not tested   Honey Thick Honey Thick Liquid: Not tested   Puree Puree: Within functional limits Presentation: Spoon (fed; 8 tirals)   Solid     Solid: Impaired Presentation: Spoon (fed; 4 trials) Oral Phase Impairments: Impaired mastication (min increased Time) Oral Phase Functional Implications: Impaired mastication (time needed) Pharyngeal Phase Impairments:  (none)       Jerilynn Som, MS, [facility] [phone] Watson,Katherine 01/29/2021,1:59 PM

## 2021-01-29 NOTE — Progress Notes (Addendum)
PROGRESS NOTE    Andrew Bird  TGG:[id number] DOB: December 01, 1928 DOA: 01/27/2021 PCP: Dione Housekeeper, MD    Brief Narrative:  Andrew Bird is a 87 y.o. male with medical history significant for hypertension, hyperlipidemia, BPH, subdural hematoma requiring bur hole in November 2016, dementia, who is admitted to [facility] on 01/27/2021 with acute metabolic encephalopathy after presenting from home  to [facility] Emergency Department for evaluation of altered mental status.Noncontrast CT of the head showed no evidence of acute intracranial process, including no evidence of intracranial hemorrhage, while showing diffuse cortical atrophy. Postvoid residual scan showed greater than 450 cc of urine.  While in the ED, the patient was noted to be agitated, attempting to pull out IVs.  Consequently, he received Haldol 2 mg IV x1 initially.  He also received a 1 L lactated Ringer bolus.  2/5-wife at bedside.  Patient pleasantly confused.  Wife requested neurology to see patient as patient follows urology as outpatient  2/6 still confused, in mittens.  Started on dysphagia 3 diet.  Sitter at bedside who reported patient was trying to get out of bed earlier this a.m.  Consultants:     Procedures:   Antimicrobials:       Subjective: Confused, sitter at bedside  Objective: Vitals:   01/28/21 1556 01/28/21 1952 01/29/21 0500 01/29/21 0640  BP: (!) 166/84 (!) 161/101  132/75  Pulse: 72 74  65  Resp: 15 18  18   Temp: 97.9 F (36.6 C) 98.1 F (36.7 C)  97.8 F (36.6 C)  TempSrc:  Oral  Oral  SpO2: 99% 99%  100%  Weight:   71 kg   Height:        Intake/Output Summary (Last 24 hours) at 01/29/2021 0853 Last data filed at 01/29/2021 0650 Gross per 24 hour  Intake 647.26 ml  Output 600 ml  Net 47.26 ml   Filed Weights   01/27/21 2159 01/28/21 0433 01/29/21 0500  Weight: 71.8 kg 70.6 kg 71 kg    Examination:  Calm, confused, NAD CTA, no wheeze rales  throughout, Regular S1-S2 no gallops Soft benign positive bowel sounds No edema Awake alert but confused Mood and affect appropriate in current setting    Data Reviewed: I have personally reviewed following labs and imaging studies  CBC: Recent Labs  Lab 01/27/21 1145 01/28/21 0825  WBC 6.3 7.6  NEUTROABS 4.0  --   HGB 13.1 13.7  HCT 39.0 40.6  MCV 99.5 98.1  PLT 143* 134*   Basic Metabolic Panel: Recent Labs  Lab 01/27/21 1145 01/28/21 0825 01/29/21 0604  NA 140 139 135  K 4.3 4.4 4.0  CL 107 106 105  CO2 23 23 20*  GLUCOSE 99 84 121*  BUN 43* 38* 39*  CREATININE 2.06* 1.83* 1.96*  CALCIUM 9.1 9.4 9.1  MG  --  1.8  --    GFR: Estimated Creatinine Clearance: 25.2 mL/min (A) (by C-G formula based on SCr of 1.96 mg/dL (H)). Liver Function Tests: Recent Labs  Lab 01/27/21 1145 01/28/21 0825  AST 20 25  ALT 16 17  ALKPHOS 58 61  BILITOT 1.1 1.5*  PROT 6.6 6.2*  ALBUMIN 3.9 3.8   No results for input(s): LIPASE, AMYLASE in the last 168 hours. Recent Labs  Lab 01/27/21 1152  AMMONIA 23   Coagulation Profile: No results for input(s): INR, PROTIME in the last 168 hours. Cardiac Enzymes: No results for input(s): CKTOTAL, CKMB, CKMBINDEX, TROPONINI in the last 168  hours. BNP (last 3 results) No results for input(s): PROBNP in the last 8760 hours. HbA1C: No results for input(s): HGBA1C in the last 72 hours. CBG: No results for input(s): GLUCAP in the last 168 hours. Lipid Profile: No results for input(s): CHOL, HDL, LDLCALC, TRIG, CHOLHDL, LDLDIRECT in the last 72 hours. Thyroid Function Tests: Recent Labs    01/27/21 1145  TSH 1.722   Anemia Panel: No results for input(s): VITAMINB12, FOLATE, FERRITIN, TIBC, IRON, RETICCTPCT in the last 72 hours. Sepsis Labs: No results for input(s): PROCALCITON, LATICACIDVEN in the last 168 hours.  Recent Results (from the past 240 hour(s))  SARS Coronavirus 2 by RT PCR (hospital order, performed in [facility] hospital lab) Nasopharyngeal Nasopharyngeal Swab     Status: None   Collection Time: 01/27/21 11:51 AM   Specimen: Nasopharyngeal Swab  Result Value Ref Range Status   SARS Coronavirus 2 NEGATIVE NEGATIVE Final    Comment: (NOTE) SARS-CoV-2 target nucleic acids are NOT DETECTED.  The SARS-CoV-2 RNA is generally detectable in upper and lower respiratory specimens during the acute phase of infection. The lowest concentration of SARS-CoV-2 viral copies this assay can detect is 250 copies / mL. A negative result does not preclude SARS-CoV-2 infection and should not be used as the sole basis for treatment or other patient management decisions.  A negative result may occur with improper specimen collection / handling, submission of specimen other than nasopharyngeal swab, presence of viral mutation(s) within the areas targeted by this assay, and inadequate number of viral copies (<250 copies / mL). A negative result must be combined with clinical observations, patient history, and epidemiological information.  Fact Sheet for Patients:   BoilerBrush.com.cy  Fact Sheet for Healthcare Providers: https://pope.com/  This test is not yet approved or  cleared by the [place] FDA and has been authorized for detection and/or diagnosis of SARS-CoV-2 by FDA under an Emergency Use Authorization (EUA).  This EUA will remain in effect (meaning this test can be used) for the duration of the COVID-19 declaration under Section 564(b)(1) of the Act, 21 U.S.C. section 360bbb-3(b)(1), unless the authorization is terminated or revoked sooner.  Performed at [facility], [address] [place], [place] [address]          Radiology Studies: DG Chest 2 View  Result Date: 01/27/2021 CLINICAL DATA:  Syncope, altered mental status EXAM: CHEST - 2 VIEW COMPARISON:  01/27/2021 FINDINGS: Bilateral chronic interstitial thickening. No focal  consolidation. No pleural effusion or pneumothorax. Heart and mediastinal contours are unremarkable. Severe osteoarthritis of the left glenohumeral joint. IMPRESSION: No active cardiopulmonary disease. Electronically Signed   By: Elige Ko   On: 01/27/2021 12:48   CT Head Wo Contrast  Result Date: 01/27/2021 CLINICAL DATA:  Altered mental status. EXAM: CT HEAD WITHOUT CONTRAST TECHNIQUE: Contiguous axial images were obtained from the base of the skull through the vertex without intravenous contrast. COMPARISON:  November 21, 2015. FINDINGS: Brain: Mild diffuse cortical atrophy is noted. No mass effect or midline shift is noted. Ventricular size is within normal limits. There is no evidence of mass lesion, hemorrhage or acute infarction. Vascular: No hyperdense vessel or unexpected calcification. Skull: Status post left craniotomy.  No acute abnormality is noted. Sinuses/Orbits: No acute finding. Other: None. IMPRESSION: Mild diffuse cortical atrophy. No acute intracranial abnormality seen. Electronically Signed   By: Lupita Raider M.D.   On: 01/27/2021 12:14        Scheduled Meds: . Chlorhexidine Gluconate Cloth  6 each Topical Q0600  . finasteride  5 mg Oral Daily  . hydrALAZINE  5 mg Intravenous Once  . tamsulosin  0.4 mg Oral QPC supper  . timolol  1 drop Both Eyes Daily   Continuous Infusions: . dextrose 5 % and 0.45% NaCl 50 mL/hr at 01/28/21 1501    Assessment & Plan:   Principal Problem:   Acute metabolic encephalopathy Active Problems:   AKI (acute kidney injury) (HCC)   Urinary retention   BPH (benign prostatic hyperplasia)   Hypertension   ELICEO GLADU is a 87 y.o. male with medical history significant for hypertension, hyperlipidemia, BPH, subdural hematoma requiring bur hole in November 2016, dementia, who is admitted to [facility] on 01/27/2021 with acute metabolic encephalopathy after presenting from home  to [facility] Emergency Department for  evaluation of altered mental status.  #) Acute metabolic encephalopathy: likely secondary to urinary retention and AKI in the setting of underlying baseline dementia.  Also possibly medication induced including gabapentin  Negative for acute abnormality 2/6-still confused, but likley has underlying dementia which making the ms changes worse  Continue tx of underlying issues        #) Acute kidney injury superimposed on CKD stage IIIa: Presented with a creatinine of 2.06  Had post void residual greater than 450 cc in ED  Baseline 1.5-1.9 Has history of BPH  Foley in place  2/6 creatinie increased mildly to 1.96 but close to baseline. Continue ivf Avoid nephrotoxic medications Monitor levels     #) Urinary retention: In the context of a documented history of BPH, with elevated postvoid residual in the ED 2/6 continue Flomax and Proscar  Urologist input was appreciated -recommend keeping indwelling Foley type several days.  We will need to follow-up with urology as outpatient     #) Essential hypertension: Elevated.  Start amlodipine 5mg  , increase as tolerated. Continue to hold ACEI due to aki       #) Hyperlipidemia: start statin  consult PT/OT   DVT prophylaxis: scd Code Status:full  Family Communication: none Status is: Inpatient  Remains inpatient appropriate because:Inpatient level of care appropriate due to severity of illness   Dispo: The patient is from: Home              Anticipated d/c is to: TBD              Anticipated d/c date is: 2 days              Patient currently is not medically stable to d/c.still confused. Needs PT/OT evaluation   Difficult to place patient No               LOS: 2 days   Time spent: 35 min with >50% on coc    , MD [facility] Pager [phone]  If 7PM-7AM, please contact night-coverage 01/29/2021, 8:53 AM

## 2021-01-30 LAB — CREATININE, SERUM
Creatinine, Ser: 1.81 mg/dL — ABNORMAL HIGH (ref 0.61–1.24)
GFR, Estimated: 35 mL/min — ABNORMAL LOW (ref >60)

## 2021-01-30 LAB — CREATININE, URINE, RANDOM: Creatinine, Urine: 105 mg/dL

## 2021-01-30 LAB — SODIUM, URINE, RANDOM: Sodium, Ur: 120 mmol/L

## 2021-01-30 LAB — BUN: BUN: 36 mg/dL — ABNORMAL HIGH (ref 8–23)

## 2021-01-30 MED ORDER — LEVETIRACETAM 500 MG PO TABS
500.0000 mg | ORAL_TABLET | Freq: Two times a day (BID) | ORAL | Status: DC
Start: 2021-01-30 — End: 2021-01-31
  Administered 2021-01-30 – 2021-01-31 (×3): 500 mg via ORAL
  Filled 2021-01-30 (×3): qty 1

## 2021-01-30 MED ORDER — AMLODIPINE BESYLATE 5 MG PO TABS
5.0000 mg | ORAL_TABLET | Freq: Once | ORAL | Status: AC
  Administered 2021-01-30: 5 mg via ORAL
  Filled 2021-01-30: qty 1

## 2021-01-30 MED ORDER — ENSURE ENLIVE PO LIQD
237.0000 mL | Freq: Three times a day (TID) | ORAL | Status: DC
  Administered 2021-01-30 – 2021-02-02 (×5): 237 mL via ORAL

## 2021-01-30 MED ORDER — SERTRALINE HCL 50 MG PO TABS
75.0000 mg | ORAL_TABLET | Freq: Every day | ORAL | Status: DC
  Administered 2021-01-30 – 2021-02-02 (×4): 75 mg via ORAL
  Filled 2021-01-30 (×4): qty 2

## 2021-01-30 MED ORDER — AMLODIPINE BESYLATE 10 MG PO TABS
10.0000 mg | ORAL_TABLET | Freq: Every day | ORAL | Status: DC
  Administered 2021-01-31 – 2021-02-02 (×3): 10 mg via ORAL
  Filled 2021-01-30 (×3): qty 1

## 2021-01-30 NOTE — Evaluation (Signed)
Occupational Therapy Evaluation Patient Details Name: Andrew Bird MRN: [id number] DOB: 1928/04/07 Today's Date: 01/30/2021    History of Present Illness 87 y.o. male with medical history significant for hypertension, hyperlipidemia, BPH, subdural hematoma requiring bur hole in November 2016, dementia, who is admitted to [facility] on 01/27/2021 with acute metabolic encephalopathy after presenting from home  to [facility] Emergency Department for evaluation of altered mental status.Noncontrast CT of the head showed no evidence of acute intracranial process, including no evidence of intracranial hemorrhage, while showing diffuse cortical atrophy.   Clinical Impression   Patient presenting with decreased I in self care, balance, functional mobility/transfer, endurance, and safety awareness. No family present during evaluation. Pt with B hand mitts donned and removed for evaluation. Pt appears very restless throughout with increased time to complete tasks and attempt to verbalize answers. Pt able to answer orientation questions for self and location when given choice of two. Dr. Marylu Lund requesting therapy to evaluate and is aware of hypertension. Pt's BP in supine is 178/69. Mod A for supine >sit with seated BP of 150/121. Pt standing with max A and posterior bias and returning to supine. Unable to attempt BP in standing.  Patient will benefit from acute OT to increase overall independence in the areas of ADLs, functional mobility, and safety awareness in order to safely discharge to next venue of care.    Follow Up Recommendations  SNF    Equipment Recommendations  Other (comment) (defer to next venue of care)       Precautions / Restrictions Precautions Precautions: Fall Restrictions Weight Bearing Restrictions: No      Mobility Bed Mobility Overal bed mobility: Needs Assistance Bed Mobility: Rolling;Supine to Sit;Sit to Supine Rolling: Mod assist   Supine to sit: Mod  assist Sit to supine: Mod assist   General bed mobility comments: mod A for B LEs    Transfers Overall transfer level: Needs assistance Equipment used: None Transfers: Sit to/from Stand Sit to Stand: Mod assist;Max assist         General transfer comment: Mod lifting assistance to stand with significant posterior bias requiring mod - max A for balance before returning to bed.    Balance Overall balance assessment: Needs assistance Sitting-balance support: Feet supported Sitting balance-Leahy Scale: Fair     Standing balance support: During functional activity Standing balance-Leahy Scale: Zero                             ADL either performed or assessed with clinical judgement   ADL Overall ADL's : Needs assistance/impaired     Grooming: Wash/dry hands;Wash/dry face;Oral care;Sitting;Min guard Grooming Details (indicate cue type and reason): cuing to initiate task                             Functional mobility during ADLs: Moderate assistance;Maximal assistance;Cueing for safety;Cueing for sequencing General ADL Comments: limited secondary to hypertension     Vision Patient Visual Report: No change from baseline              Pertinent Vitals/Pain Pain Assessment: Faces Faces Pain Scale: No hurt     Hand Dominance Right   Extremity/Trunk Assessment Upper Extremity Assessment Upper Extremity Assessment: Generalized weakness   Lower Extremity Assessment Lower Extremity Assessment: Defer to PT evaluation       Communication     Cognition Arousal/Alertness: Awake/alert Behavior During  Therapy: Restless;Flat affect Overall Cognitive Status: No family/caregiver present to determine baseline cognitive functioning                                 General Comments: Pt able to verbalize his name correctly and location when given a choice of two. Pt appeared very restless and internally/externally distracted.               Home Living Family/patient expects to be discharged to:: Skilled nursing facility                                 Additional Comments: Pt reports living with wife but unable to verbalize any other home set up /PLOF information. No family present during evaluation.      Prior Functioning/Environment Level of Independence: Independent with assistive device(s)        Comments: Pt reports being mod I with use of RW but is likely poor historian.        OT Problem List: Decreased strength;Decreased activity tolerance;Decreased knowledge of use of DME or AE;Cardiopulmonary status limiting activity;Decreased safety awareness;Impaired balance (sitting and/or standing)      OT Treatment/Interventions: Self-care/ADL training;Therapeutic exercise;Energy conservation;DME and/or AE instruction;Therapeutic activities;Balance training;Patient/family education;Manual therapy;Modalities    OT Goals(Current goals can be found in the care plan section) Acute Rehab OT Goals Patient Stated Goal: unable to state OT Goal Formulation: Patient unable to participate in goal setting Time For Goal Achievement: 02/13/21 Potential to Achieve Goals: Good ADL Goals Pt Will Perform Grooming: standing;with min guard assist Pt Will Perform Lower Body Dressing: with min guard assist;sit to/from stand Pt Will Transfer to Toilet: with min guard assist;ambulating Pt Will Perform Toileting - Clothing Manipulation and hygiene: with min guard assist;sit to/from stand  OT Frequency: Min 1X/week   Barriers to D/C:    none known at this time          AM-PAC OT "6 Clicks" Daily Activity     Outcome Measure Help from another person eating meals?: A Little Help from another person taking care of personal grooming?: A Little Help from another person toileting, which includes using toliet, bedpan, or urinal?: Total Help from another person bathing (including washing, rinsing, drying)?: A Lot Help from  another person to put on and taking off regular upper body clothing?: A Little Help from another person to put on and taking off regular lower body clothing?: A Lot 6 Click Score: 14   End of Session Nurse Communication: Mobility status;Precautions  Activity Tolerance: Other (comment) (limited by hypertension) Patient left: in bed;with call bell/phone within reach;with bed alarm set  OT Visit Diagnosis: Unsteadiness on feet (R26.81);Muscle weakness (generalized) (M62.81)                Time: 6629-4765 OT Time Calculation (min): 25 min Charges:  OT General Charges $OT Visit: 1 Visit OT Evaluation $OT Eval Moderate Complexity: 1 Mod OT Treatments $Therapeutic Activity: 8-22 mins  Jackquline Denmark, MS, OTR/L , CBIS ascom [phone]  01/30/21, 12:28 PM

## 2021-01-30 NOTE — Progress Notes (Signed)
Speech Language Pathology Treatment: Dysphagia  Patient Details Name: Andrew Bird MRN: [id number] DOB: 03-28-1928 Today's Date: 01/30/2021 Time: 3382-5053 SLP Time Calculation (min) (ACUTE ONLY): 40 min  Assessment / Plan / Recommendation Clinical Impression  Pt seen today for ongoing assessment of toleration of diet; swallowing function in light of declined Cognitive functioning. Pt appears much more Confused this afternoon -- Bilat. Mitts in place, Sitter. Wife attempting to feed bites of food but pt declining. Noted agitated behaviors; picking behavior. Eyes closed most of the session; mumbled response x1 during session and did not follow any instructions.  Pt appears to present w/ oropharyngeal phase dysphagia in light of Significantly declined Cognitive status; advanced Dementia w/ agitation. This impacts his overall awareness/engagement and safety during po tasks which increases risk for aspiration, choking. Pt's risk for aspiration, as well as concern for meeting nutritional needs fully/safely, is present but can be reduced when following general aspiration precautions, using a modified diet, and given feeding assistance. He requires Max tactile/verbal/ visual cues for orientation to bolus presentation this session, and during feeding support. Pt consumed only few trials of semi-solid exhibiting a Munching behavior w/ increased oral phase time. W/ trials of puree and sips of thin liquids via Straw, oral phase was min more Timely. However, Oral Prep phase was disorganized and often he turned his head away when spoon/straw was presented at lips -- Time and light stim needed to encourage pt to take the bolus orally. Pinched straw sips of thin liquids were utilized w/ No immediate, overt coughing noted; no decline in respiratory status during/post trials. However, a mild throat clearing was noted x2 during trials of thin liquids.  D/t pt's declined Cognitive status, and his risk for aspiration,  recommend modifying the dysphagia level to a 2(minced w/ puree) w/ Thin liquids w/ strict aspiration precautions; reduce Distractions during meals and only give po's when pt is calm and participative. Pills Crushed in Puree for safer swallowing. Support w/ feeding at meals -- check for oral clearing during/post intake. NSG updated. ST services recommends follow w/ Dietician for support as he drinks BOOST at home per WIfe; also a Palliative Care for GOC. ST services can follow pt for any new needs but largely suspect that pt's advanced Dementia and his behaviors could continue to hamper oral intake and upgrade of diet. Precautions posted in room. Wife agreed. MD updated.    HPI HPI: Pt  is a 87 y.o. male with past medical history of Dementia, CAD, HTN, BPH, arthritis, seizure disorder, CKD, and intracranial hemorrhage in 2006 who presents via EMS from clinic for evaluation of altered mental status.  History is very limited from the patient secondary to his dementia and altered mental status.  Per his wife, pt has memory difficulty secondary to Dementia at baseline but seemed significantly more confused over the last 3 days has been speaking in gibberish putting on several layers of clothing when was inappropriate. He layed around in bed a couple of days but did not seek care until seeing his PCP in clinic the day of admit.  She denies that he has had any witnessed falls, cough, vomiting, diarrhea, dysuria or any other recent sick symptoms as far she can tell.  No clear precipitating events.  She does note that at baseline he is weak and has left leg has some weakness in his right leg and has to hold onto objects in the house to get around; feeds self w/ both hands holding the Cup.  Chest x-ray showed No evidence of acute cardiopulmonary process, including no evidence of infiltrate, edema, effusion, or pneumothorax.  Noncontrast CT of the head showed No evidence of acute intracranial process, including no evidence  of intracranial hemorrhage, while showing diffuse cortical atrophy.  Admitting dx: Acute metabolic encephalopathy w/ AKI and urinary retention.      SLP Plan  Continue with current plan of care       Recommendations  Diet recommendations: Dysphagia 2 (fine chop);Thin liquid Liquids provided via: Cup;Straw (monitor carefully) Medication Administration: Crushed with puree (for safer swallowing) Supervision: Staff to assist with self feeding;Full supervision/cueing for compensatory strategies Compensations: Minimize environmental distractions;Slow rate;Small sips/bites;Lingual sweep for clearance of pocketing;Multiple dry swallows after each bite/sip;Follow solids with liquid Postural Changes and/or Swallow Maneuvers: Seated upright 90 degrees;Upright 30-60 min after meal                General recommendations:  (Dietician f/u; Palliative Care f/u) Oral Care Recommendations: Oral care BID;Oral care before and after PO;Staff/trained caregiver to provide oral care Follow up Recommendations: None (TBD) SLP Visit Diagnosis: Dysphagia, oropharyngeal phase (R13.12) (Cognitive decline) Plan: Continue with current plan of care       GO                 Jerilynn Som, MS, CCC-SLP Speech Language Pathologist Rehab Services [phone] [facility] 01/30/2021, 2:33 PM

## 2021-01-30 NOTE — TOC Initial Note (Signed)
Transition of Care [facility]) - Initial/Assessment Note    Patient Details  Name: Andrew Bird MRN: [id number] Date of Birth: February 17, 1928  Transition of Care [facility]) CM/SW Contact:    Chapman Fitch, RN Phone Number: 01/30/2021, 4:01 PM  Clinical Narrative:                 Patient admitted from home with acute metabolic encephalopathy  Lives at home with wife Wife at bedside.   PCP Olmedo  Denies issues obtaining medications   PT and OT recommending SNF.   Did conference call with wife and daughter they would like patient to return home with home health services.  Both confirm that their 4 children will be able to assist after discharge. Agreeable to home health. Does not have a preference of home health agency.  Referral made to [facility] with [facility]  Agreeable to [facility] they would like to think about a hospital bed   Expected Discharge Plan: Home w Home Health Services Barriers to Discharge: Continued Medical Work up   Patient Goals and CMS Choice        Expected Discharge Plan and Services Expected Discharge Plan: Home w Home Health Services   Discharge Planning Services: CM Consult   Living arrangements for the past 2 months: Single Family Home                           HH Arranged: RN,PT,OT,Nurse's Aide,Speech Therapy,Social Work [employer] Agency: [employer] ([facility]) Date HH Agency Contacted: 01/30/21   Representative spoke with at [facility] Agency: Barbara Cower  Prior Living Arrangements/Services Living arrangements for the past 2 months: Single Family Home Lives with:: Spouse   Do you feel safe going back to the place where you live?: Yes               Activities of Daily Living Home Assistive Devices/Equipment: Bedside commode/3-in-1,Cane (specify quad or straight),Dentures (specify type),Eyeglasses,Hearing aid,Built-in shower seat,Splint (specify type) (upper and lower dentures; brace for left drop foot) ADL Screening (condition at time of  admission) Patient's cognitive ability adequate to safely complete daily activities?: No Is the patient deaf or have difficulty hearing?: Yes Does the patient have difficulty seeing, even when wearing glasses/contacts?: No Does the patient have difficulty concentrating, remembering, or making decisions?: Yes Patient able to express need for assistance with ADLs?: No Does the patient have difficulty dressing or bathing?: Yes Independently performs ADLs?: No Communication: Needs assistance Is this a change from baseline?: Change from baseline, expected to last >3 days Dressing (OT): Needs assistance Is this a change from baseline?: Change from baseline, expected to last >3 days Grooming: Needs assistance Is this a change from baseline?: Change from baseline, expected to last >3 days Feeding: Independent Bathing: Needs assistance Is this a change from baseline?: Change from baseline, expected to last >3 days Toileting: Needs assistance Is this a change from baseline?: Change from baseline, expected to last >3days In/Out Bed: Needs assistance Is this a change from baseline?: Change from baseline, expected to last >3 days Walks in Home: Needs assistance Is this a change from baseline?: Change from baseline, expected to last >3 days Does the patient have difficulty walking or climbing stairs?: Yes Weakness of Legs: Both Weakness of Arms/Hands: Both  Permission Sought/Granted                  Emotional Assessment       Orientation: : Oriented to Self  Admission diagnosis:  Urinary retention [R33.9] AKI (acute kidney injury) (HCC) [N17.9] Altered mental status, unspecified altered mental status type [R41.82] Acute metabolic encephalopathy [G93.41] Patient Active Problem List   Diagnosis Date Noted  . AKI (acute kidney injury) (HCC) 01/28/2021  . Urinary retention 01/28/2021  . BPH (benign prostatic hyperplasia)   . Hypertension   . Acute metabolic encephalopathy  01/27/2021   PCP:  Dione Housekeeper, MD Pharmacy:   [facility] [address] [place] - [address] [place] [address] Phone: [phone] Fax: [phone]  [id number] [place] [facility], [place] - [address] AT [facility] [address] [facility] [place] [address] Phone: [phone] Fax: [phone]     Social Determinants of Health (SDOH) Interventions    Readmission Risk Interventions No flowsheet data found.

## 2021-01-30 NOTE — Progress Notes (Signed)
PROGRESS NOTE    Andrew Bird  OHY:[id number] DOB: 1928/08/09 DOA: 01/27/2021 PCP: Dione Housekeeper, MD    Brief Narrative:  Andrew Bird is a 87 y.o. male with medical history significant for hypertension, hyperlipidemia, BPH, subdural hematoma requiring bur hole in November 2016, dementia, who is admitted to [facility] on 01/27/2021 with acute metabolic encephalopathy after presenting from home  to [facility] Emergency Department for evaluation of altered mental status.Noncontrast CT of the head showed no evidence of acute intracranial process, including no evidence of intracranial hemorrhage, while showing diffuse cortical atrophy. Postvoid residual scan showed greater than 450 cc of urine.  While in the ED, the patient was noted to be agitated, attempting to pull out IVs.  Consequently, he received Haldol 2 mg IV x1 initially.  He also received a 1 L lactated Ringer bolus.  2/5-wife at bedside.  Patient pleasantly confused.  Wife requested neurology to see patient as patient follows urology as outpatient  2/6 still confused, in mittens.  Started on dysphagia 3 diet.  Sitter at bedside who reported patient was trying to get out of bed earlier this a.m.  2/7- eyes closed. PT recommends SNF.  Again spoke to wife today she does say he has dementia for several years and it seems like progressively worsening.  She does state that she is unable to take care of him at this time.  Also wife reports that Haldol is making him too sleepy.  Consultants:     Procedures:   Antimicrobials:       Subjective: Eyes closed, will not open for me.  I think he is confused but is very difficult to assess for mental status with him really not being interactive with me  Objective: Vitals:   01/29/21 2335 01/30/21 0031 01/30/21 0501 01/30/21 0808  BP: (!) 160/93  (!) 167/89 (!) 162/84  Pulse: 91  68 67  Resp: (!) 22  18   Temp: 98.2 F (36.8 C)  97.8 F (36.6 C) 97.9 F  (36.6 C)  TempSrc: Axillary  Oral Oral  SpO2: 99%  100% 96%  Weight:  65.5 kg    Height:        Intake/Output Summary (Last 24 hours) at 01/30/2021 0831 Last data filed at 01/30/2021 9485 Gross per 24 hour  Intake 1412.65 ml  Output 1050 ml  Net 362.65 ml   Filed Weights   01/28/21 0433 01/29/21 0500 01/30/21 0031  Weight: 70.6 kg 71 kg 65.5 kg    Examination: Calm, eyes closed NAD CTA, no wheeze rales rhonchi anteriorly Regular S1-S2 no gallops Soft benign positive bowel sounds No edema Unable assess neuro exam     Data Reviewed: I have personally reviewed following labs and imaging studies  CBC: Recent Labs  Lab 01/27/21 1145 01/28/21 0825  WBC 6.3 7.6  NEUTROABS 4.0  --   HGB 13.1 13.7  HCT 39.0 40.6  MCV 99.5 98.1  PLT 143* 134*   Basic Metabolic Panel: Recent Labs  Lab 01/27/21 1145 01/28/21 0825 01/29/21 0604 01/30/21 0419  NA 140 139 135  --   K 4.3 4.4 4.0  --   CL 107 106 105  --   CO2 23 23 20*  --   GLUCOSE 99 84 121*  --   BUN 43* 38* 39* 36*  CREATININE 2.06* 1.83* 1.96* 1.81*  CALCIUM 9.1 9.4 9.1  --   MG  --  1.8  --   --  GFR: Estimated Creatinine Clearance: 25.1 mL/min (A) (by C-G formula based on SCr of 1.81 mg/dL (H)). Liver Function Tests: Recent Labs  Lab 01/27/21 1145 01/28/21 0825  AST 20 25  ALT 16 17  ALKPHOS 58 61  BILITOT 1.1 1.5*  PROT 6.6 6.2*  ALBUMIN 3.9 3.8   No results for input(s): LIPASE, AMYLASE in the last 168 hours. Recent Labs  Lab 01/27/21 1152  AMMONIA 23   Coagulation Profile: No results for input(s): INR, PROTIME in the last 168 hours. Cardiac Enzymes: No results for input(s): CKTOTAL, CKMB, CKMBINDEX, TROPONINI in the last 168 hours. BNP (last 3 results) No results for input(s): PROBNP in the last 8760 hours. HbA1C: No results for input(s): HGBA1C in the last 72 hours. CBG: No results for input(s): GLUCAP in the last 168 hours. Lipid Profile: No results for input(s): CHOL, HDL,  LDLCALC, TRIG, CHOLHDL, LDLDIRECT in the last 72 hours. Thyroid Function Tests: Recent Labs    01/27/21 1145  TSH 1.722   Anemia Panel: No results for input(s): VITAMINB12, FOLATE, FERRITIN, TIBC, IRON, RETICCTPCT in the last 72 hours. Sepsis Labs: No results for input(s): PROCALCITON, LATICACIDVEN in the last 168 hours.  Recent Results (from the past 240 hour(s))  SARS Coronavirus 2 by RT PCR (hospital order, performed in [facility] hospital lab) Nasopharyngeal Nasopharyngeal Swab     Status: None   Collection Time: 01/27/21 11:51 AM   Specimen: Nasopharyngeal Swab  Result Value Ref Range Status   SARS Coronavirus 2 NEGATIVE NEGATIVE Final    Comment: (NOTE) SARS-CoV-2 target nucleic acids are NOT DETECTED.  The SARS-CoV-2 RNA is generally detectable in upper and lower respiratory specimens during the acute phase of infection. The lowest concentration of SARS-CoV-2 viral copies this assay can detect is 250 copies / mL. A negative result does not preclude SARS-CoV-2 infection and should not be used as the sole basis for treatment or other patient management decisions.  A negative result may occur with improper specimen collection / handling, submission of specimen other than nasopharyngeal swab, presence of viral mutation(s) within the areas targeted by this assay, and inadequate number of viral copies (<250 copies / mL). A negative result must be combined with clinical observations, patient history, and epidemiological information.  Fact Sheet for Patients:   BoilerBrush.com.cy  Fact Sheet for Healthcare Providers: https://pope.com/  This test is not yet approved or  cleared by the [place] FDA and has been authorized for detection and/or diagnosis of SARS-CoV-2 by FDA under an Emergency Use Authorization (EUA).  This EUA will remain in effect (meaning this test can be used) for the duration of the COVID-19 declaration  under Section 564(b)(1) of the Act, 21 U.S.C. section 360bbb-3(b)(1), unless the authorization is terminated or revoked sooner.  Performed at [facility], [address] [place], [place] [address]          Radiology Studies: No results found.      Scheduled Meds: . amLODipine  5 mg Oral Daily  . Chlorhexidine Gluconate Cloth  6 each Topical Q0600  . finasteride  5 mg Oral Daily  . hydrALAZINE  5 mg Intravenous Once  . tamsulosin  0.4 mg Oral QPC supper  . timolol  1 drop Both Eyes Daily   Continuous Infusions: . dextrose 5 % and 0.45% NaCl 75 mL/hr at 01/30/21 0302    Assessment & Plan:   Principal Problem:   Acute metabolic encephalopathy Active Problems:   AKI (acute kidney injury) (HCC)  Urinary retention   BPH (benign prostatic hyperplasia)   Hypertension   Andrew Bird is a 87 y.o. male with medical history significant for hypertension, hyperlipidemia, BPH, subdural hematoma requiring bur hole in November 2016, dementia, who is admitted to [facility] on 01/27/2021 with acute metabolic encephalopathy after presenting from home  to [facility] Emergency Department for evaluation of altered mental status.  #) Acute metabolic encephalopathy: likely secondary to urinary retention and AKI in the setting of underlying baseline dementia.  Also possibly medication induced including gabapentin  Negative for acute abnormality 2/7-with demetia, difficult to assess his mental status since improving.  He does receive Haldol and he is sleepy for me so is difficult to see if he has improved today.   We will continue to monitor and DC Haldol per wife's request  PT/OT -SNF        #) Acute kidney injury superimposed on CKD stage IIIa: Presented with a creatinine of 2.06  Had post void residual greater than 450 cc in ED  Baseline 1.5-1.9 Has history of BPH  Foley in place  2/7-improving, creatinine 1.8 Continue gentle  hydration Avoid nephrotoxic medications      #) Urinary retention: In the context of a documented history of BPH, with elevated postvoid residual in the ED 2/7 continue Flomax and Proscar  Urology was following.  Recommended keeping indwelling Foley for 7 days and f/u with urology as outpt.      #) Essential hypertension: Elevated, increase amlodipine to 10mg  qd Hold acei for aki        #) Hyperlipidemia: start statin  consult PT/OT   DVT prophylaxis: scd Code Status:full  Family Communication: none Status is: Inpatient  Remains inpatient appropriate because:Inpatient level of care appropriate due to severity of illness   Dispo: The patient is from: Home              Anticipated d/c is to: SNF              Anticipated d/c date is: 2 days              Patient currently is not medically stable to d/c.still confused. Dc haldol see if he wakens. Continue ivf.   Difficult to place patient No               LOS: 3 days   Time spent: 35 min with >50% on coc    , MD [facility] Pager [phone]  If 7PM-7AM, please contact night-coverage 01/30/2021, 8:31 AM

## 2021-01-30 NOTE — Care Management Important Message (Signed)
Important Message  Patient Details  Name: Andrew Bird MRN: [id number] Date of Birth: [birth year]   Medicare Important Message Given:  Yes     [name] 01/30/2021, 10:43 AM

## 2021-01-30 NOTE — Evaluation (Addendum)
Physical Therapy Evaluation Patient Details Name: Andrew Bird MRN: [id number] DOB: 09-06-1928 Today's Date: 01/30/2021   History of Present Illness  87 y.o. male with medical history significant for hypertension, hyperlipidemia, BPH, subdural hematoma requiring bur hole in November 2016, dementia, who is admitted to [facility] on 01/27/2021 with acute metabolic encephalopathy after presenting from home  to [facility] Emergency Department for evaluation of altered mental status.Noncontrast CT of the head showed no evidence of acute intracranial process, including no evidence of intracranial hemorrhage, while showing diffuse cortical atrophy.    Clinical Impression  Addendum: MD cleared pt for participation in PT in setting of elevated BP. Pt seen for PT evaluation. Pt requires mod<>total assist for bed mobility & transfers at EOB. Pt unable to coordinate taking side steps at EOB. Pt unable to provide home set up/PLOF information (info taken from chart). Pt would benefit from acute PT services to progress gait with LRAD & +2 assist would be beneficial for increased safety. Currently recommending STR upon d/c to maximize independence, decreased fall risk, and decrease caregiver burden prior to return home.   Supine at end of session: BP 169/97 mmHg (MAP 119), HR 65 bpm    Follow Up Recommendations SNF    Equipment Recommendations   (TBD in next venue)    Recommendations for Other Services       Precautions / Restrictions Precautions Precautions: Fall Restrictions Weight Bearing Restrictions: No      Mobility  Bed Mobility Overal bed mobility: Needs Assistance Bed Mobility: Supine to Sit;Sit to Supine Rolling: Mod assist   Supine to sit: Max assist;Total assist;HOB elevated Sit to supine: Total assist;Max assist;HOB elevated   General bed mobility comments: mod A for B LEs    Transfers Overall transfer level: Needs assistance Equipment used: 1 person hand  held assist Transfers: Sit to/from Stand Sit to Stand: Max assist         General transfer comment: posterior lean, assistance for powering up  Ambulation/Gait             General Gait Details: attempted to have pt take side steps at EOB but pt unable to coordinate/impaired motor planning  Stairs            Wheelchair Mobility    Modified Rankin (Stroke Patients Only)       Balance Overall balance assessment: Needs assistance Sitting-balance support: Feet supported Sitting balance-Leahy Scale: Poor Sitting balance - Comments: min assist sitting EOB Postural control: Posterior lean Standing balance support: During functional activity;Single extremity supported Standing balance-Leahy Scale: Poor Standing balance comment: mod/max assist static standing                             Pertinent Vitals/Pain Pain Assessment: No/denies pain Faces Pain Scale: No hurt    Home Living Family/patient expects to be discharged to:: Skilled nursing facility Living Arrangements: Spouse/significant other               Additional Comments: Pt reports living with wife but unable to verbalize any other home set up /PLOF information. No family present during evaluation.    Prior Function Level of Independence: Independent with assistive device(s)         Comments: Pt reports being mod I with use of RW but is likely poor historian.     Hand Dominance   Dominant Hand: Right    Extremity/Trunk Assessment   Upper Extremity Assessment Upper Extremity  Assessment: Generalized weakness    Lower Extremity Assessment Lower Extremity Assessment: Generalized weakness       Communication      Cognition Arousal/Alertness: Lethargic Behavior During Therapy: Flat affect Overall Cognitive Status: No family/caregiver present to determine baseline cognitive functioning                                 General Comments: Pt able to verbalize his  first name only during session. Alert during mobility but once positioned back in bed pt closing eyes & drifiting off to sleep.      General Comments      Exercises     Assessment/Plan    PT Assessment Patient needs continued PT services  PT Problem List Decreased strength;Decreased mobility;Decreased safety awareness;Decreased knowledge of precautions;Decreased coordination;Decreased activity tolerance;Decreased cognition;Decreased knowledge of use of DME;Decreased balance       PT Treatment Interventions DME instruction;Therapeutic activities;Gait training;Therapeutic exercise;Patient/family education;Stair training;Balance training;Wheelchair mobility training;Functional mobility training;Neuromuscular re-education;Manual techniques;Cognitive remediation    PT Goals (Current goals can be found in the Care Plan section)  Acute Rehab PT Goals Patient Stated Goal: unable to state PT Goal Formulation: With patient Time For Goal Achievement: 02/13/21    Frequency Min 2X/week   Barriers to discharge        Co-evaluation               AM-PAC PT "6 Clicks" Mobility  Outcome Measure Help needed turning from your back to your side while in a flat bed without using bedrails?: Total Help needed moving from lying on your back to sitting on the side of a flat bed without using bedrails?: Total Help needed moving to and from a bed to a chair (including a wheelchair)?: Total Help needed standing up from a chair using your arms (e.g., wheelchair or bedside chair)?: Total Help needed to walk in hospital room?: Total Help needed climbing 3-5 steps with a railing? : Total 6 Click Score: 6    End of Session Equipment Utilized During Treatment: Gait belt Activity Tolerance: Patient limited by lethargy;Patient limited by fatigue Patient left: in bed;with call bell/phone within reach;with bed alarm set;with nursing/sitter in room Nurse Communication: Mobility status (BP) PT Visit  Diagnosis: Unsteadiness on feet (R26.81);Difficulty in walking, not elsewhere classified (R26.2);Muscle weakness (generalized) (M62.81)    Time: 0109-3235 PT Time Calculation (min) (ACUTE ONLY): 21 min   Charges:   PT Evaluation $PT Eval Low Complexity: 1 Low PT Treatments $Therapeutic Activity: 8-22 mins        Aleda Grana, PT, DPT 01/30/21, 4:21 PM   Sandi Mariscal 01/30/2021, 1:41 PM

## 2021-01-31 MED ORDER — HYDRALAZINE HCL 10 MG PO TABS
10.0000 mg | ORAL_TABLET | Freq: Three times a day (TID) | ORAL | Status: DC
  Administered 2021-02-01 (×2): 10 mg via ORAL
  Filled 2021-01-31 (×3): qty 1

## 2021-01-31 MED ORDER — LEVETIRACETAM 100 MG/ML PO SOLN
500.0000 mg | Freq: Two times a day (BID) | ORAL | Status: DC
  Administered 2021-02-01 – 2021-02-02 (×4): 500 mg via ORAL
  Filled 2021-01-31 (×5): qty 5

## 2021-01-31 NOTE — Progress Notes (Signed)
Physical Therapy Treatment Patient Details Name: Andrew Bird MRN: [id number] DOB: 1928/02/28 Today's Date: 01/31/2021    History of Present Illness 87 y.o. male with medical history significant for hypertension, hyperlipidemia, BPH, subdural hematoma requiring bur hole in November 2016, dementia, who is admitted to [facility] on 01/27/2021 with acute metabolic encephalopathy after presenting from home  to [facility] Emergency Department for evaluation of altered mental status.Noncontrast CT of the head showed no evidence of acute intracranial process, including no evidence of intracranial hemorrhage, while showing diffuse cortical atrophy.    PT Comments    Pt was supine in bed with HOB elevated ~10 degrees. Supportive spouse at bedside with sitter present. BUE mitts x 2 on each hand. Pt is awake and alert upon entry. Very minimal verbal interactions. Did not talk until he became agitated. Mitts were removed for session and reapplied at conclusion of session. He required extensive +2 assist throughout session for safety. He does demonstrate good strength in all extremities however unable to follow commands and becomes combative with mobility. Required +3 to safely clean bowls after BM due to agitation/combative. Lengthy education with spouse about safety concerns with DC home. She states," I can't take care of him like this." She is agreeable to STR. She states that her daughter did not want pt to go to rehab. Pt's spouse requested author reach out to daughter to discuss pt's current abilities. At conclusion of sessio;, pt was in bed with sitter present, bed alarm in place, BUE mitts donned and pt less agitated. Acute PT highly recommend DC to SNF if cognition/agitation does not improve.    Follow Up Recommendations  SNF     Equipment Recommendations  Other (comment) (ongoing assessment)    Recommendations for Other Services       Precautions / Restrictions  Precautions Precautions: Fall Restrictions Weight Bearing Restrictions: No    Mobility  Bed Mobility Overal bed mobility: Needs Assistance Bed Mobility: Supine to Sit;Sit to Supine Rolling: +2 for safety/equipment;Max assist   Supine to sit: Total assist;Max assist;+2 for safety/equipment;+2 for physical assistance Sit to supine: Total assist;+2 for physical assistance;+2 for safety/equipment   General bed mobility comments: Attempted getting pt to EOB however pt unable to actively initiate movements to EOB. max assist of one with 2nd person close for safety. pt becomes very agitated and combative. required + 2 total assist to return pt to supine and reapply mitts/ prevent pt from kicking. pt does demonstrate good strength but is limited by cognition/agitation/ ability to safely follow commands  Transfers                 General transfer comment: unable to progress to OOB activity 2/2 to cognition/willingness to participate  Ambulation/Gait                 Stairs             Wheelchair Mobility    Modified Rankin (Stroke Patients Only)       Balance Overall balance assessment: Needs assistance Sitting-balance support: Feet supported Sitting balance-Leahy Scale: Poor                                      Cognition Arousal/Alertness: Awake/alert Behavior During Therapy: Anxious;Agitated;Restless;Impulsive Overall Cognitive Status: History of cognitive impairments - at baseline (spouse states cognition significantly different from one week prior to admission)  General Comments: pt is awake throughout session but mostly non verbal. Only cussed at therapist when upset/agitated      Exercises      General Comments General comments (skin integrity, edema, etc.): Lengthy discussion with pt's spouse and pt's daughter(by phone) discussing DC disposition. pt's spouse does not feel she can safely  bring patient home. agreeable to SNF after session.      Pertinent Vitals/Pain Pain Assessment: No/denies pain Faces Pain Scale: No hurt    Home Living                      Prior Function            PT Goals (current goals can now be found in the care plan section) Acute Rehab PT Goals Patient Stated Goal: unable to state Progress towards PT goals: Not progressing toward goals - comment (cognition/agitation limiting progression)    Frequency    Min 2X/week      PT Plan Current plan remains appropriate    Co-evaluation              AM-PAC PT "6 Clicks" Mobility   Outcome Measure  Help needed turning from your back to your side while in a flat bed without using bedrails?: Total Help needed moving from lying on your back to sitting on the side of a flat bed without using bedrails?: Total Help needed moving to and from a bed to a chair (including a wheelchair)?: Total Help needed standing up from a chair using your arms (e.g., wheelchair or bedside chair)?: Total Help needed to walk in hospital room?: Total Help needed climbing 3-5 steps with a railing? : Total 6 Click Score: 6    End of Session Equipment Utilized During Treatment: Gait belt Activity Tolerance: Treatment limited secondary to agitation Patient left: in bed;with call bell/phone within reach;with bed alarm set;with nursing/sitter in room Nurse Communication: Mobility status PT Visit Diagnosis: Unsteadiness on feet (R26.81);Difficulty in walking, not elsewhere classified (R26.2);Muscle weakness (generalized) (M62.81)     Time: 3818-2993 PT Time Calculation (min) (ACUTE ONLY): 30 min  Charges:  $Therapeutic Activity: 23-37 mins                     Jetta Lout PTA 01/31/21, 7:56 PM

## 2021-01-31 NOTE — Progress Notes (Addendum)
Speech Language Pathology Treatment: Dysphagia  Patient Details Name: Andrew Bird MRN: [id number] DOB: August 12, 1928 Today's Date: 01/31/2021 Time: 1330-1410 SLP Time Calculation (min) (ACUTE ONLY): 40 min  Assessment / Plan / Recommendation Clinical Impression  Pt seen again today for ongoing assessment of toleration of diet; swallowing function in light of declined Cognitive functioning. Pt appears to present w/ continued Confused -- Bilat. Mitts in place, Sitter. Wife present also at bedside. She stated pt had eaten "well" w/ her at the Lunch meal taking bites of foods -- noted more PUREED foods eaten of the Lunch meal. Noted picking behaviors w/ UEs. Eyes closed most of the session; mumbled response x1 during session and did not follow any instructions. Max assist given to sit pt Upright in bed for po trials w/ SLP.  Pt appears to present w/ oropharyngeal phase dysphagia in light of Significantly declined Cognitive status; advanced Dementia w/ agitation. This impacts his overall awareness/engagement and safety during po taskswhich increases risk for aspiration, choking. Pt's risk for aspiration, as well as concern for meeting nutritional needs fully/safely, is present but can be reduced when following general aspiration precautions, using a modified diet, and given feeding assistance. He requires Mod-Max tactile/verbal/ visualcues for orientation to bolus presentation this session, and during feeding support. Pt consumed few trials of thin liquids VIA CUP w/ delayed Cough noted x1/6 trials -- pt required hand over hand support to steady the Cup and provide guidance of it to his mouth. However, this is the safest way for pt to take sips of thin liquids as he is Participating in the sipping/drinking process(oral prep stage). Pt's participation may help reduce risk for aspiration. Per report, he exhibited a Munching behavior w/ increased oral phase time w/ MINCED foods -- suspect this is d/t the  Cognitive decline/Dementia, and he was not wearing the lower Denture plate(?). Oral Prep phase was disorganized w/ sips of liquids; Time and light stim needed to encourage pt to take the bolus orally. Pinched straw sips of thin liquids have been utilized w/ No immediate, overt coughing noted, though pt may benefit from less straw use if coughing noted when using a straw(often less oral control of liquids).  No decline of Pulmonary status noted w/ trials accepted; pt drifted off to sleep post trials.   D/t pt's declined Cognitive status, and his risk for aspiration, recommend continuing a Modified diet of dysphagia level 2(minced w/ added purees) w/ Thin liquids VIA CUP w/ strict aspiration precautions; reduce Distractions during meals and only give po's when pt is calm and participative. Pills Crushed in Puree for safer swallowing. Support w/ feeding at all meals -- check for oral clearing during/post intake. NSG updated. Ongoing monitoring by Dietician for support as he drinks BOOST at home per WIfe; also a Palliative Care for GOC.   Pt appears at risk for aspiration moreso w/ Thin liquids despite precautions d/t degree of Cognitive decline/Dementia ongoing -- negative sequelae of Aspiration can include Pulmonary decline. Wife was informed of this. ST services can follow pt for any New needs, But, largely suspect that pt's advanced Dementia and his behaviors could continue to hamper oral intake and any upgrade of diet. Pt will need to be monitored closely for need to further Downgrade the diet(Thickened liquids) if any negative sequelae of aspiration is noted, including Pulmonary decline. Encouraged Wife to f/u w/ MDs, and IF returning home w/ the PCP, if any increased Coughing when drinking thin liquids is noted to discuss if diet downgrade  is desired at the time respecting pt's GOC. Again, discussion w/ Palliative Care for GOC is strongly recommended to address GOC and pt's wishes as well as ongoing education  of aspiration risk and its impact on Pulmonary status, s/s of decline.  Precautions posted in room; Handouts on diet consistency and prep and aspiration precautions given to WIfe. Wife agreed. MD/NSG/SW updated.      HPI HPI: Pt  is a 87 y.o. male with past medical history of Dementia, CAD, HTN, BPH, arthritis, seizure disorder, CKD, and intracranial hemorrhage in 2006 who presents via EMS from clinic for evaluation of altered mental status.  History is very limited from the patient secondary to his dementia and altered mental status.  Per his wife, pt has memory difficulty secondary to Dementia at baseline but seemed significantly more confused over the last 3 days has been speaking in gibberish putting on several layers of clothing when was inappropriate. He layed around in bed a couple of days but did not seek care until seeing his PCP in clinic the day of admit.  She denies that he has had any witnessed falls, cough, vomiting, diarrhea, dysuria or any other recent sick symptoms as far she can tell.  No clear precipitating events.  She does note that at baseline he is weak and has left leg has some weakness in his right leg and has to hold onto objects in the house to get around; feeds self w/ both hands holding the Cup.   Chest x-ray showed No evidence of acute cardiopulmonary process, including no evidence of infiltrate, edema, effusion, or pneumothorax.  Noncontrast CT of the head showed No evidence of acute intracranial process, including no evidence of intracranial hemorrhage, while showing diffuse cortical atrophy.  Admitting dx: Acute metabolic encephalopathy w/ AKI and urinary retention.      SLP Plan  Continue with current plan of care       Recommendations  Diet recommendations: Dysphagia 2 (fine chop);Thin liquid Liquids provided via: Cup;No straw Medication Administration: Crushed with puree (as able for safer swallowing) Supervision: Staff to assist with self feeding;Full  supervision/cueing for compensatory strategies Compensations: Minimize environmental distractions;Slow rate;Small sips/bites;Lingual sweep for clearance of pocketing;Multiple dry swallows after each bite/sip;Follow solids with liquid Postural Changes and/or Swallow Maneuvers: Seated upright 90 degrees;Upright 30-60 min after meal                General recommendations:  (Palliative Care f/u for GOC) Oral Care Recommendations: Oral care BID;Oral care before and after PO;Staff/trained caregiver to provide oral care (Dentures) Follow up Recommendations: None (TBD) SLP Visit Diagnosis: Dysphagia, oropharyngeal phase (R13.12) (Cognitive decline baseline, Dementia) Plan: Continue with current plan of care       GO                 Jerilynn Som, MS, CCC-SLP Speech Language Pathologist Rehab Services [phone] [facility] 01/31/2021, 2:11 PM

## 2021-01-31 NOTE — Progress Notes (Signed)
Initial Nutrition Assessment  DOCUMENTATION CODES:   Severe malnutrition in context of social or environmental circumstances  INTERVENTION:   Ensure Enlive po BID, each supplement provides 350 kcal and 20 grams of protein  Magic cup TID with meals, each supplement provides 290 kcal and 9 grams of protein  NUTRITION DIAGNOSIS:   Severe Malnutrition related to social / environmental circumstances (dementia) as evidenced by moderate fat depletion,severe fat depletion,severe muscle depletion.  GOAL:   Patient will meet greater than or equal to 90% of their needs  MONITOR:   PO intake,Supplement acceptance,Labs,Weight trends,Skin,I & O's  REASON FOR ASSESSMENT:   Consult Assessment of nutrition requirement/status  ASSESSMENT:   87 y.o. male with medical history significant for hypertension, hyperlipidemia, BPH, subdural hematoma requiring bur hole in November 2016, dementia, who is admitted to [facility] on 01/27/2021 with acute metabolic encephalopathy after presenting from home  to [facility] Emergency Department for evaluation of altered mental status.   Met with pt in room today. Pt with dementia and is unable to provide any history. Sitter at bedside reports that patient ate some pureed pineapple for breakfast this morning but did not drink any of his Ensure. RD suspects pt with poor appetite and oral intake at baseline r/t dementia. Per family report, pt drinks one Boost per day at home. RD will add supplements to help pt meet his estimated needs. Pt is likely at refeed risk. Per chart, pt is down 9lbs(6%) over the past 5 months; RD unsure how recently weight loss occurred.   Medications reviewed and include: NaCl w/ 5% dextrose '@75ml' /hr  Labs reviewed: BUN 36(H), creat 1.81(H)  NUTRITION - FOCUSED PHYSICAL EXAM:  Flowsheet Row Most Recent Value  Orbital Region Moderate depletion  Upper Arm Region Severe depletion  Thoracic and Lumbar Region Moderate  depletion  Buccal Region Moderate depletion  Temple Region Severe depletion  Clavicle Bone Region Severe depletion  Clavicle and Acromion Bone Region Severe depletion  Scapular Bone Region Severe depletion  Dorsal Hand Severe depletion  Patellar Region Severe depletion  Anterior Thigh Region Severe depletion  Posterior Calf Region Severe depletion  Edema (RD Assessment) None  Hair Reviewed  Eyes Reviewed  Mouth Reviewed  Skin Reviewed  Nails Reviewed     Diet Order:   Diet Order            DIET DYS 2 Room service appropriate? Yes with Assist; Fluid consistency: Thin  Diet effective now                EDUCATION NEEDS:   No education needs have been identified at this time  Skin:  Skin Assessment: Reviewed RN Assessment (ecchymosis)  Last BM:  2/8- type 5  Height:   Ht Readings from Last 1 Encounters:  01/27/21 6' (1.829 m)    Weight:   Wt Readings from Last 1 Encounters:  01/30/21 65.5 kg    Ideal Body Weight:  80.9 kg  BMI:  Body mass index is 19.58 kg/m.  Estimated Nutritional Needs:   Kcal:  1800-2100kcal/day  Protein:  90-105g/day  Fluid:  1.7-1.9L/day  Koleen Distance MS, RD, LDN Please refer to [facility] for RD and/or RD on-call/weekend/after hours pager

## 2021-01-31 NOTE — TOC Progression Note (Signed)
Transition of Care [facility]) - Progression Note    Patient Details  Name: Andrew Bird MRN: [id number] Date of Birth: [birth year]  Transition of Care [facility]) CM/SW Contact  Chapman Fitch, RN Phone Number: 01/31/2021, 3:17 PM  Clinical Narrative:    Confirmed with wife she will still not interested in pursing SNF Pallaitive consult requested Hospital bed and WC orders   Expected Discharge Plan: Home w Home Health Services Barriers to Discharge: Continued Medical Work up  Expected Discharge Plan and Services Expected Discharge Plan: Home w Home Health Services   Discharge Planning Services: CM Consult   Living arrangements for the past 2 months: Single Family Home                           HH Arranged: RN,PT,OT,Nurse's Aide,Speech Therapy,Social Work [place] Agency: [facility] Date HH Agency Contacted: 01/30/21   Representative spoke with at [facility] Agency: Barbara Cower   Social Determinants of Health (SDOH) Interventions    Readmission Risk Interventions No flowsheet data found.

## 2021-01-31 NOTE — Progress Notes (Signed)
PROGRESS NOTE    Andrew Bird  GGE:[id number] DOB: 1928/10/10 DOA: 01/27/2021 PCP: Dione Housekeeper, MD    Brief Narrative:  Andrew Bird is a 87 y.o. male with medical history significant for hypertension, hyperlipidemia, BPH, subdural hematoma requiring bur hole in November 2016, dementia, who is admitted to [facility] on 01/27/2021 with acute metabolic encephalopathy after presenting from home  to [facility] Emergency Department for evaluation of altered mental status.Noncontrast CT of the head showed no evidence of acute intracranial process, including no evidence of intracranial hemorrhage, while showing diffuse cortical atrophy. Postvoid residual scan showed greater than 450 cc of urine.  While in the ED, the patient was noted to be agitated, attempting to pull out IVs.  Consequently, he received Haldol 2 mg IV x1 initially.  He also received a 1 L lactated Ringer bolus.  Since his discharge he has remained confused.  Not interactive.  At times Somnolent and closes his eyes.  PT has recommended SNF however family do not want this time on home health.  Urology was consulted for his urinary retention.  Plan is when he is at his baseline which is difficult to say with his dementia he can be discharged home with home health.  Per wife he is currently not at his baseline.  He is requiring one-on-one sitter and mittens.   Consultants:     Procedures:   Antimicrobials:       Subjective: Patient is eyes closed, minimally open and not answering to my questions.  In mittens.   Objective: Vitals:   01/31/21 0519 01/31/21 0818 01/31/21 1215 01/31/21 1518  BP: 135/87 (!) 148/82 (!) 155/84 (!) 151/93  Pulse: 74 81 74 (!) 102  Resp: 16   16  Temp: 98 F (36.7 C)  97.9 F (36.6 C) 98.5 F (36.9 C)  TempSrc: Axillary     SpO2: 100% 100% 100% 100%  Weight:      Height:        Intake/Output Summary (Last 24 hours) at 01/31/2021 1740 Last data filed at 01/31/2021  1612 Gross per 24 hour  Intake 237 ml  Output 1400 ml  Net -1163 ml   Filed Weights   01/28/21 0433 01/29/21 0500 01/30/21 0031  Weight: 70.6 kg 71 kg 65.5 kg    Examination: Calm, less somnolent than yesterday.  Confused sitter at bedside CTA, no wheeze rales rhonchi's Regular S1-S2 no gallops Soft benign positive bowel sounds Upper extremity in mittens, lower extremity no edema Grossly intact however difficult to assess Mood and affect appropriate in current setting     Data Reviewed: I have personally reviewed following labs and imaging studies  CBC: Recent Labs  Lab 01/27/21 1145 01/28/21 0825  WBC 6.3 7.6  NEUTROABS 4.0  --   HGB 13.1 13.7  HCT 39.0 40.6  MCV 99.5 98.1  PLT 143* 134*   Basic Metabolic Panel: Recent Labs  Lab 01/27/21 1145 01/28/21 0825 01/29/21 0604 01/30/21 0419  NA 140 139 135  --   K 4.3 4.4 4.0  --   CL 107 106 105  --   CO2 23 23 20*  --   GLUCOSE 99 84 121*  --   BUN 43* 38* 39* 36*  CREATININE 2.06* 1.83* 1.96* 1.81*  CALCIUM 9.1 9.4 9.1  --   MG  --  1.8  --   --    GFR: Estimated Creatinine Clearance: 25.1 mL/min (A) (by C-G formula based on SCr of  1.81 mg/dL (H)). Liver Function Tests: Recent Labs  Lab 01/27/21 1145 01/28/21 0825  AST 20 25  ALT 16 17  ALKPHOS 58 61  BILITOT 1.1 1.5*  PROT 6.6 6.2*  ALBUMIN 3.9 3.8   No results for input(s): LIPASE, AMYLASE in the last 168 hours. Recent Labs  Lab 01/27/21 1152  AMMONIA 23   Coagulation Profile: No results for input(s): INR, PROTIME in the last 168 hours. Cardiac Enzymes: No results for input(s): CKTOTAL, CKMB, CKMBINDEX, TROPONINI in the last 168 hours. BNP (last 3 results) No results for input(s): PROBNP in the last 8760 hours. HbA1C: No results for input(s): HGBA1C in the last 72 hours. CBG: No results for input(s): GLUCAP in the last 168 hours. Lipid Profile: No results for input(s): CHOL, HDL, LDLCALC, TRIG, CHOLHDL, LDLDIRECT in the last 72  hours. Thyroid Function Tests: No results for input(s): TSH, T4TOTAL, FREET4, T3FREE, THYROIDAB in the last 72 hours. Anemia Panel: No results for input(s): VITAMINB12, FOLATE, FERRITIN, TIBC, IRON, RETICCTPCT in the last 72 hours. Sepsis Labs: No results for input(s): PROCALCITON, LATICACIDVEN in the last 168 hours.  Recent Results (from the past 240 hour(s))  SARS Coronavirus 2 by RT PCR (hospital order, performed in [facility] hospital lab) Nasopharyngeal Nasopharyngeal Swab     Status: None   Collection Time: 01/27/21 11:51 AM   Specimen: Nasopharyngeal Swab  Result Value Ref Range Status   SARS Coronavirus 2 NEGATIVE NEGATIVE Final    Comment: (NOTE) SARS-CoV-2 target nucleic acids are NOT DETECTED.  The SARS-CoV-2 RNA is generally detectable in upper and lower respiratory specimens during the acute phase of infection. The lowest concentration of SARS-CoV-2 viral copies this assay can detect is 250 copies / mL. A negative result does not preclude SARS-CoV-2 infection and should not be used as the sole basis for treatment or other patient management decisions.  A negative result may occur with improper specimen collection / handling, submission of specimen other than nasopharyngeal swab, presence of viral mutation(s) within the areas targeted by this assay, and inadequate number of viral copies (<250 copies / mL). A negative result must be combined with clinical observations, patient history, and epidemiological information.  Fact Sheet for Patients:   BoilerBrush.com.cy  Fact Sheet for Healthcare Providers: https://pope.com/  This test is not yet approved or  cleared by the [place] FDA and has been authorized for detection and/or diagnosis of SARS-CoV-2 by FDA under an Emergency Use Authorization (EUA).  This EUA will remain in effect (meaning this test can be used) for the duration of the COVID-19 declaration under  Section 564(b)(1) of the Act, 21 U.S.C. section 360bbb-3(b)(1), unless the authorization is terminated or revoked sooner.  Performed at [facility], [address], [place], [place] [address]          Radiology Studies: No results found.      Scheduled Meds: . amLODipine  10 mg Oral Daily  . Chlorhexidine Gluconate Cloth  6 each Topical Q0600  . feeding supplement  237 mL Oral TID BM  . finasteride  5 mg Oral Daily  . hydrALAZINE  5 mg Intravenous Once  . levETIRAcetam  500 mg Oral BID  . sertraline  75 mg Oral Daily  . tamsulosin  0.4 mg Oral QPC supper  . timolol  1 drop Both Eyes Daily   Continuous Infusions: . dextrose 5 % and 0.45% NaCl 75 mL/hr at 01/31/21 0600    Assessment & Plan:   Principal Problem:  Acute metabolic encephalopathy Active Problems:   AKI (acute kidney injury) (HCC)   Urinary retention   BPH (benign prostatic hyperplasia)   Hypertension   Andrew Bird is a 87 y.o. male with medical history significant for hypertension, hyperlipidemia, BPH, subdural hematoma requiring bur hole in November 2016, dementia, who is admitted to [facility] on 01/27/2021 with acute metabolic encephalopathy after presenting from home  to [facility] Emergency Department for evaluation of altered mental status.  #) Acute metabolic encephalopathy: likely secondary to urinary retention and AKI in the setting of underlying baseline dementia.  Also possibly medication induced including gabapentin  Negative for acute abnormality UA negative 2/8-MS not at baseline.  PT OT recommended SNF however patient family refused and wanted to take him home with home health when he is at his baseline mental status Haldol was discontinued yesterday as patient's wife requested         #) Acute kidney injury superimposed on CKD stage IIIa: Presented with a creatinine of 2.06  Had post void residual greater than 450 cc in ED  Baseline  1.5-1.9 Has history of BPH  2/8 Foley in place Creatinine has improved and is at baseline.  Today 1.81 Continue gentle hydration  void nephrotoxic meds       #) Urinary retention: In the context of a documented history of BPH, with elevated postvoid residual in the ED Continue Flomax and Proscar  Urology was following.  Recommended keeping indwelling Foley for 7 days and follow-up with urology as outpatient     #) Essential hypertension: Labile at times Continue amlodipine Add hydralazine 10 mg 3 times daily increase as tolerated        #) Hyperlipidemia: start statin  consult PT/OT   DVT prophylaxis: scd Code Status:full  Family Communication: none Status is: Inpatient  Remains inpatient appropriate because:Inpatient level of care appropriate due to severity of illness   Dispo: The patient is from: Home              Anticipated d/c is to: Home with home health              Anticipated d/c date is: 2 days              Patient currently is not medically stable to d/c.still confused.  Still needing IV fluids for hydration . mental status is not at baseline yet   difficult to place patient No               LOS: 4 days   Time spent: 35 min with >50% on coc    Lynn Ito, MD [facility] Pager [phone]  If 7PM-7AM, please contact night-coverage 01/31/2021, 5:40 PM

## 2021-02-01 DIAGNOSIS — Z515 Encounter for palliative care: Secondary | ICD-10-CM

## 2021-02-01 DIAGNOSIS — Z7189 Other specified counseling: Secondary | ICD-10-CM

## 2021-02-01 DIAGNOSIS — E43 Unspecified severe protein-calorie malnutrition: Secondary | ICD-10-CM | POA: Insufficient documentation

## 2021-02-01 DIAGNOSIS — G9341 Metabolic encephalopathy: Secondary | ICD-10-CM

## 2021-02-01 LAB — CBC
HCT: 37.1 % — ABNORMAL LOW (ref 39.0–52.0)
Hemoglobin: 13 g/dL (ref 13.0–17.0)
MCH: 33.3 pg (ref 26.0–34.0)
MCHC: 35 g/dL (ref 30.0–36.0)
MCV: 95.1 fL (ref 80.0–100.0)
Platelets: 139 10*3/uL — ABNORMAL LOW (ref 150–400)
RBC: 3.9 MIL/uL — ABNORMAL LOW (ref 4.22–5.81)
RDW: 12.5 % (ref 11.5–15.5)
WBC: 10.6 10*3/uL — ABNORMAL HIGH (ref 4.0–10.5)
nRBC: 0 % (ref 0.0–0.2)

## 2021-02-01 LAB — BASIC METABOLIC PANEL
Anion gap: 8 (ref 5–15)
BUN: 33 mg/dL — ABNORMAL HIGH (ref 8–23)
CO2: 21 mmol/L — ABNORMAL LOW (ref 22–32)
Calcium: 8.6 mg/dL — ABNORMAL LOW (ref 8.9–10.3)
Chloride: 106 mmol/L (ref 98–111)
Creatinine, Ser: 1.87 mg/dL — ABNORMAL HIGH (ref 0.61–1.24)
GFR, Estimated: 34 mL/min — ABNORMAL LOW (ref >60)
Glucose, Bld: 130 mg/dL — ABNORMAL HIGH (ref 70–99)
Potassium: 4 mmol/L (ref 3.5–5.1)
Sodium: 135 mmol/L (ref 135–145)

## 2021-02-01 LAB — METHYLMALONIC ACID, SERUM: Methylmalonic Acid, Quantitative: 190 nmol/L (ref 0–378)

## 2021-02-01 MED ORDER — HYDRALAZINE HCL 25 MG PO TABS
25.0000 mg | ORAL_TABLET | Freq: Three times a day (TID) | ORAL | Status: DC
  Administered 2021-02-01 – 2021-02-02 (×3): 25 mg via ORAL
  Filled 2021-02-01 (×3): qty 1

## 2021-02-01 NOTE — NC FL2 (Signed)
[place] MEDICAID FL2 LEVEL OF CARE SCREENING TOOL     IDENTIFICATION  Patient Name: Andrew Bird Birthdate: Jan 23, 1928 Sex: male Admission Date (Current Location): 01/27/2021  [place] and [place] Number:  Chiropodist and Address:  [facility], [address], [place], [place] [address]      Provider Number: [id number]  Attending Physician Name and Address:  Tresa Moore, MD  Relative Name and Phone Number:       Current Level of Care: Hospital Recommended Level of Care: Skilled Nursing Facility Prior Approval Number:    Date Approved/Denied:   PASRR Number: [id number]  Discharge Plan: SNF    Current Diagnoses: Patient Active Problem List   Diagnosis Date Noted  . Protein-calorie malnutrition, severe 02/01/2021  . AKI (acute kidney injury) (HCC) 01/28/2021  . Urinary retention 01/28/2021  . BPH (benign prostatic hyperplasia)   . Hypertension   . Acute metabolic encephalopathy 01/27/2021    Orientation RESPIRATION BLADDER Height & Weight      (disoriented x4)  Normal Incontinent,Indwelling catheter Weight: 65.5 kg Height:  6' (182.9 cm)  BEHAVIORAL SYMPTOMS/MOOD NEUROLOGICAL BOWEL NUTRITION STATUS      Incontinent Diet (dys 2)  AMBULATORY STATUS COMMUNICATION OF NEEDS Skin   Extensive Assist Verbally Skin abrasions,Bruising                       Personal Care Assistance Level of Assistance              Functional Limitations Info             SPECIAL CARE FACTORS FREQUENCY  PT (By licensed PT),OT (By licensed OT)                    Contractures Contractures Info: Not present    Additional Factors Info  Code Status,Allergies Code Status Info: full Allergies Info: aspirin           Current Medications (02/01/2021):  This is the current hospital active medication list Current Facility-Administered Medications  Medication Dose Route Frequency Provider Last Rate Last Admin  .  acetaminophen (TYLENOL) tablet 650 mg  650 mg Oral Q6H PRN Howerter, Justin B, DO       Or  . acetaminophen (TYLENOL) suppository 650 mg  650 mg Rectal Q6H PRN Howerter, Justin B, DO      . amLODipine (NORVASC) tablet 10 mg  10 mg Oral Daily Lynn Ito, MD   10 mg at 01/31/21 1035  . Chlorhexidine Gluconate Cloth 2 % PADS 6 each  6 each Topical Q0600 Howerter, Justin B, DO   6 each at 01/31/21 1035  . dextrose 5 %-0.45 % sodium chloride infusion   Intravenous Continuous Lynn Ito, MD 75 mL/hr at 01/31/21 2211 New Bag at 01/31/21 2211  . feeding supplement (ENSURE ENLIVE / ENSURE PLUS) liquid 237 mL  237 mL Oral TID BM Lynn Ito, MD   237 mL at 01/30/21 2116  . finasteride (PROSCAR) tablet 5 mg  5 mg Oral Daily Lynn Ito, MD   5 mg at 01/31/21 1034  . hydrALAZINE (APRESOLINE) injection 5 mg  5 mg Intravenous Once Howerter, Justin B, DO      . hydrALAZINE (APRESOLINE) tablet 10 mg  10 mg Oral Q8H Lynn Ito, MD   10 mg at 02/01/21 0745  . levETIRAcetam (KEPPRA) 100 MG/ML solution 500 mg  500 mg Oral BID Otelia Sergeant, RPH   500 mg at 02/01/21  0013  . sertraline (ZOLOFT) tablet 75 mg  75 mg Oral Daily Lynn Ito, MD   75 mg at 01/31/21 1034  . tamsulosin (FLOMAX) capsule 0.4 mg  0.4 mg Oral QPC supper Lynn Ito, MD   0.4 mg at 01/31/21 0973  . timolol (TIMOPTIC) 0.5 % ophthalmic solution 1 drop  1 drop Both Eyes Daily Lynn Ito, MD   1 drop at 01/31/21 1243     Discharge Medications: Please see discharge summary for a list of discharge medications.  Relevant Imaging Results:  Relevant Lab Results:   Additional Information ss [id number]  Chapman Fitch, RN

## 2021-02-01 NOTE — Progress Notes (Signed)
Occupational Therapy Treatment Patient Details Name: Andrew Bird MRN: [id number] DOB: 1928-04-28 Today's Date: 02/01/2021    History of present illness 87 y.o. male with medical history significant for hypertension, hyperlipidemia, BPH, subdural hematoma requiring bur hole in November 2016, dementia, who is admitted to [facility] on 01/27/2021 with acute metabolic encephalopathy after presenting from home  to [facility] Emergency Department for evaluation of altered mental status.Noncontrast CT of the head showed no evidence of acute intracranial process, including no evidence of intracranial hemorrhage, while showing diffuse cortical atrophy.   OT comments  Upon entering the room, pt supine in bed with wife present in the room. Pt alert and smiling this session. Wife brought in pt shoes and L AFO. OT threading socks onto feet but he was unable to pull up further. Total A to don AFO and B shoes. Total A to EOB. Pt seated for 5 minutes of static sitting with min guard progressing to close supervision for balance. Pt standing x 2 reps with max A each time. Pt noted to have small BM and able to remain standing with max A while therapist assisted with hygiene. Pt taking seated rest break and standing again with attempts to take side step. Pt needing total A for weight shift and to move R LE. Pt returned to seated position and total A to supine. Pt very fatigued and appeared to be asleep as therapist exits the room. Pt continues to benefit from OT intervention with recommendation for SNF at discharge.   Follow Up Recommendations  SNF    Equipment Recommendations  Other (comment) (defer to next venue of care)       Precautions / Restrictions Precautions Precautions: Fall       Mobility Bed Mobility Overal bed mobility: Needs Assistance Bed Mobility: Supine to Sit;Sit to Supine     Supine to sit: Total assist Sit to supine: Total assist   General bed mobility comments:  total A to EOB  Transfers Overall transfer level: Needs assistance Equipment used: 1 person hand held assist Transfers: Sit to/from Stand Sit to Stand: Max assist         General transfer comment: max A to stand from standard bed height x 2 reps    Balance Overall balance assessment: Needs assistance Sitting-balance support: Feet supported;Feet unsupported Sitting balance-Leahy Scale: Fair Sitting balance - Comments: min guard progressing to close supervision   Standing balance support: During functional activity;Single extremity supported Standing balance-Leahy Scale: Poor Standing balance comment: max A static standing                           ADL either performed or assessed with clinical judgement   ADL Overall ADL's : Needs assistance/impaired             Lower Body Bathing: Total assistance;Bed level Lower Body Bathing Details (indicate cue type and reason): total A to don B socks, shoes, and L AFO                             Vision Patient Visual Report: No change from baseline            Cognition Arousal/Alertness: Awake/alert Behavior During Therapy: Restless Overall Cognitive Status: History of cognitive impairments - at baseline  General Comments: Pt was awake and smiling during session. Pt followed commands inconsistently with increased time. Wife present.                   Pertinent Vitals/ Pain       Pain Assessment: Faces Faces Pain Scale: No hurt     Prior Functioning/Environment              Frequency  Min 1X/week        Progress Toward Goals  OT Goals(current goals can now be found in the care plan section)  Progress towards OT goals: Progressing toward goals  Acute Rehab OT Goals Patient Stated Goal: to get him moving OT Goal Formulation: With family Time For Goal Achievement: 02/13/21 Potential to Achieve Goals: Good  Plan Discharge plan  remains appropriate       AM-PAC OT "6 Clicks" Daily Activity     Outcome Measure   Help from another person eating meals?: A Little Help from another person taking care of personal grooming?: A Little Help from another person toileting, which includes using toliet, bedpan, or urinal?: Total Help from another person bathing (including washing, rinsing, drying)?: Total Help from another person to put on and taking off regular upper body clothing?: A Little Help from another person to put on and taking off regular lower body clothing?: A Lot 6 Click Score: 13    End of Session Equipment Utilized During Treatment: Other (comment) (B shoes and L AFO from home)  OT Visit Diagnosis: Unsteadiness on feet (R26.81);Muscle weakness (generalized) (M62.81)   Activity Tolerance Patient tolerated treatment well   Patient Left in bed;with call bell/phone within reach;with bed alarm set;with family/visitor present   Nurse Communication Mobility status        Time: 1610-9604 OT Time Calculation (min): 28 min  Charges: OT General Charges $OT Visit: 1 Visit OT Treatments $Self Care/Home Management : 8-22 mins $Therapeutic Activity: 8-22 mins  Jackquline Denmark, MS, OTR/L , CBIS ascom [phone]  02/01/21, 4:31 PM

## 2021-02-01 NOTE — TOC Progression Note (Addendum)
Transition of Care [facility]) - Progression Note    Patient Details  Name: JARAMIAH BOSSARD MRN: [id number] Date of Birth: Dec 08, 1928  Transition of [facility] CM/SW Contact  Chapman Fitch, RN Phone Number: 02/01/2021, 9:28 AM  Clinical Narrative:     Notified by PT that wife now agreeable to SNF Voicemail left for wife  Existing PASRR fl2 sent for signature Bed search initiated    12:10 - wife at bed side confirms she is interested in SNF.  [facility] is her first choice.  They are unable to offer at this time  Expected Discharge Plan: Home w Home Health Services Barriers to Discharge: Continued Medical Work up  Expected Discharge Plan and Services Expected Discharge Plan: Home w Home Health Services   Discharge Planning Services: CM Consult   Living arrangements for the past 2 months: Single Family Home                           HH Arranged: RN,PT,OT,Nurse's Aide,Speech Therapy,Social Work [employer] Agency: [facility] Date HH Agency Contacted: 01/30/21   Representative spoke with at [facility] Agency: Barbara Cower   Social Determinants of Health (SDOH) Interventions    Readmission Risk Interventions No flowsheet data found.

## 2021-02-01 NOTE — Progress Notes (Signed)
PROGRESS NOTE    Andrew Bird  OEU:[id number] DOB: Oct 04, 1928 DOA: 01/27/2021 PCP: Dione Housekeeper, MD   Brief Narrative: 87 y.o.malewith medical history significant forhypertension, hyperlipidemia,BPH,subdural hematoma requiring bur hole in November 2016, dementia,who is admitted to [facility] on 2/4/2022with acute metabolic encephalopathyafter presenting from home to [facility] Emergency Department for evaluation of altered mental status.Noncontrast CT of the head showedno evidence of acute intracranial process, including no evidence of intracranial hemorrhage, while showingdiffuse cortical atrophy.Postvoid residual scan showed greater than 450 cc of urine. While in the ED,the patient was noted to be agitated,attempting to pull out IVs. Consequently, he received Haldol2 mg IV x1 initially.He also received a 1 L lactated Ringer bolus.  Baseline mental status is unclear however patient has not been terribly interactive.  At times somnolent.  Initial recommendation was for skilled nursing facility.  Patient family initially refused wanting to take him home however reconsidered and now is agreeable to SNF placement.  Urology consulted for urinary retention.  Foley catheter placed with plans to discharge with Foley in place and follow-up with urology 7 days post discharge for voiding trial.    Assessment & Plan:   Principal Problem:   Acute metabolic encephalopathy Active Problems:   AKI (acute kidney injury) (HCC)   Urinary retention   BPH (benign prostatic hyperplasia)   Hypertension   Protein-calorie malnutrition, severe  Acute metabolic encephalopathy Unclear etiology Not likely infectious Likely secondary to urinary retention and acute kidney injury Possible medication effect as well Plan: Plan for SNF at time of disposition Frequent reorienting measures Avoid sedatives and anticholinergics Patient has one-to-one sitter.  Will need to  discontinue 24 hours prior to any disposition to skilled nursing facility.  Will either DC sitter today or tomorrow  Acute kidney injury superimposed on CKD stage IIIa Presented with a creatinine of 2.06  Had post void residual greater than 450 cc in ED  Baseline 1.5-1.9 Has history of BPH  2/8 Foley in place Creatinine has improved and is at baseline.   Avoid nephrotoxins  Urinary retention In the context of a documented history of BPH elevated postvoid residual in the ED Continue Flomax and Proscar  Urology was following.  Recommended keeping indwelling Foley for 7 days and follow-up with urology as outpatient   Essential hypertension Labile at times Continue amlodipine Add hydralazine 10 mg 3 times daily increase as tolerated  Hyperlipidemia statin   DVT prophylaxis: SCDs Code Status: Full Family Communication:  Disposition Plan:  Level of care: Med-Surg  Consultants:   None  Procedures:   Foley catheterization  Antimicrobials:   None   Subjective: Patient seen and examined.  Somnolent but does open eyes eyes.  Able to hold a simple conversation.  Objective: Vitals:   02/01/21 0426 02/01/21 0811 02/01/21 1038 02/01/21 1209  BP: (!) 157/86 (!) 170/87 (!) 142/83 (!) 146/78  Pulse: 87 85 66 69  Resp: 16 18 18 18   Temp: 97.9 F (36.6 C) 98.7 F (37.1 C) 98.6 F (37 C) 97.8 F (36.6 C)  TempSrc: Oral Oral  Oral  SpO2: 98% 96% 97% 97%  Weight:      Height:        Intake/Output Summary (Last 24 hours) at 02/01/2021 1424 Last data filed at 02/01/2021 1019 Gross per 24 hour  Intake 480 ml  Output 650 ml  Net -170 ml   Filed Weights   01/28/21 0433 01/29/21 0500 01/30/21 0031  Weight: 70.6 kg 71 kg 65.5 kg  Examination:  General exam: No acute distress.  Somnolent Respiratory system: Poor respiratory effort.  Lungs clear.  Normal work of breathing.  Room air Cardiovascular system: S1 & S2 heard, RRR. No JVD, murmurs, rubs, gallops or clicks.  No pedal edema. Gastrointestinal system: Abdomen is nondistended, soft and nontender. No organomegaly or masses felt. Normal bowel sounds heard. Central nervous system: Somnolent.  Oriented to person.  No focal deficits Extremities: Symmetric 5 x 5 power. Skin: Pale, scattered excoriations Psychiatry: Judgement and insight appear impaired. Mood & affect flattened.     Data Reviewed: I have personally reviewed following labs and imaging studies  CBC: Recent Labs  Lab 01/27/21 1145 01/28/21 0825 02/01/21 0439  WBC 6.3 7.6 10.6*  NEUTROABS 4.0  --   --   HGB 13.1 13.7 13.0  HCT 39.0 40.6 37.1*  MCV 99.5 98.1 95.1  PLT 143* 134* 139*   Basic Metabolic Panel: Recent Labs  Lab 01/27/21 1145 01/28/21 0825 01/29/21 0604 01/30/21 0419 02/01/21 0810  NA 140 139 135  --  135  K 4.3 4.4 4.0  --  4.0  CL 107 106 105  --  106  CO2 23 23 20*  --  21*  GLUCOSE 99 84 121*  --  130*  BUN 43* 38* 39* 36* 33*  CREATININE 2.06* 1.83* 1.96* 1.81* 1.87*  CALCIUM 9.1 9.4 9.1  --  8.6*  MG  --  1.8  --   --   --    GFR: Estimated Creatinine Clearance: 24.3 mL/min (A) (by C-G formula based on SCr of 1.87 mg/dL (H)). Liver Function Tests: Recent Labs  Lab 01/27/21 1145 01/28/21 0825  AST 20 25  ALT 16 17  ALKPHOS 58 61  BILITOT 1.1 1.5*  PROT 6.6 6.2*  ALBUMIN 3.9 3.8   No results for input(s): LIPASE, AMYLASE in the last 168 hours. Recent Labs  Lab 01/27/21 1152  AMMONIA 23   Coagulation Profile: No results for input(s): INR, PROTIME in the last 168 hours. Cardiac Enzymes: No results for input(s): CKTOTAL, CKMB, CKMBINDEX, TROPONINI in the last 168 hours. BNP (last 3 results) No results for input(s): PROBNP in the last 8760 hours. HbA1C: No results for input(s): HGBA1C in the last 72 hours. CBG: No results for input(s): GLUCAP in the last 168 hours. Lipid Profile: No results for input(s): CHOL, HDL, LDLCALC, TRIG, CHOLHDL, LDLDIRECT in the last 72 hours. Thyroid  Function Tests: No results for input(s): TSH, T4TOTAL, FREET4, T3FREE, THYROIDAB in the last 72 hours. Anemia Panel: No results for input(s): VITAMINB12, FOLATE, FERRITIN, TIBC, IRON, RETICCTPCT in the last 72 hours. Sepsis Labs: No results for input(s): PROCALCITON, LATICACIDVEN in the last 168 hours.  Recent Results (from the past 240 hour(s))  SARS Coronavirus 2 by RT PCR (hospital order, performed in [facility] hospital lab) Nasopharyngeal Nasopharyngeal Swab     Status: None   Collection Time: 01/27/21 11:51 AM   Specimen: Nasopharyngeal Swab  Result Value Ref Range Status   SARS Coronavirus 2 NEGATIVE NEGATIVE Final    Comment: (NOTE) SARS-CoV-2 target nucleic acids are NOT DETECTED.  The SARS-CoV-2 RNA is generally detectable in upper and lower respiratory specimens during the acute phase of infection. The lowest concentration of SARS-CoV-2 viral copies this assay can detect is 250 copies / mL. A negative result does not preclude SARS-CoV-2 infection and should not be used as the sole basis for treatment or other patient management decisions.  A negative result may occur with improper specimen  collection / handling, submission of specimen other than nasopharyngeal swab, presence of viral mutation(s) within the areas targeted by this assay, and inadequate number of viral copies (<250 copies / mL). A negative result must be combined with clinical observations, patient history, and epidemiological information.  Fact Sheet for Patients:   BoilerBrush.com.cy  Fact Sheet for Healthcare Providers: https://pope.com/  This test is not yet approved or  cleared by the [place] FDA and has been authorized for detection and/or diagnosis of SARS-CoV-2 by FDA under an Emergency Use Authorization (EUA).  This EUA will remain in effect (meaning this test can be used) for the duration of the COVID-19 declaration under Section 564(b)(1)  of the Act, 21 U.S.C. section 360bbb-3(b)(1), unless the authorization is terminated or revoked sooner.  Performed at [facility], [address] [place], [place] [address]          Radiology Studies: No results found.      Scheduled Meds: . amLODipine  10 mg Oral Daily  . Chlorhexidine Gluconate Cloth  6 each Topical Q0600  . feeding supplement  237 mL Oral TID BM  . finasteride  5 mg Oral Daily  . hydrALAZINE  25 mg Oral Q8H  . levETIRAcetam  500 mg Oral BID  . sertraline  75 mg Oral Daily  . tamsulosin  0.4 mg Oral QPC supper  . timolol  1 drop Both Eyes Daily   Continuous Infusions: . dextrose 5 % and 0.45% NaCl 75 mL/hr at 02/01/21 1309     LOS: 5 days    Time spent: 15 minutes    Tresa Moore, MD [facility] Pager [phone]  If 7PM-7AM, please contact night-coverage 02/01/2021, 2:24 PM

## 2021-02-01 NOTE — Consult Note (Addendum)
Consultation Note Date: 02/01/2021   Patient Name: Andrew Bird  DOB: [birth year]  MRN: [id number]  Age / Sex: 87 y.o., male  PCP: Dione Housekeeper, MD Referring Physician: Tresa Moore, MD  Reason for Consultation: Establishing goals of care  HPI/Patient Profile: Andrew Bird is a 87 y.o. male with medical history significant for hypertension, hyperlipidemia, BPH, subdural hematoma requiring bur hole in November 2016, dementia, who is admitted to [facility] on 01/27/2021 with acute metabolic encephalopathy after presenting from home  to [facility] Emergency Department for evaluation of altered mental status.  Clinical Assessment and Goals of Care: Patient is resting in bed. Sitter is at bedside. Wife is at bedside. She states he has had dementia for around 7 years. At home, at baseline he walks almost constantly, mostly outside. He is continent of bowel and bladder. He is able to feed himself and is always hungry.   We discussed his diagnoses, prognosis, GOC, EOL wishes disposition and options.  Created space and opportunity for patient  to explore thoughts and feelings regarding current medical information.   A detailed discussion was had today regarding advanced directives.  Concepts specific to code status, artifical feeding and hydration, IV antibiotics and rehospitalization were discussed.  The difference between an aggressive medical intervention path and a comfort care path was discussed.  Values and goals of care important to patient and family were attempted to be elicited.  Discussed limitations of medical interventions to prolong quality of life in some situations and discussed the concept of human mortality.  She states he would never want to be placed on a ventilator. She states he was a DNR a while back, but she revoked that because he had improved. She states she  sees he has declined and she would no longer want CPR. She states 3 of her children agree with this, but 1 child does not. She states she knows dementia is progressive and she does not want him to suffer. She states she does not formally want to change from full code/full scope without talking with the children first. 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       SUMMARY OF RECOMMENDATIONS   Will follow up tomorrow.    Prognosis:   Poor      Primary Diagnoses: Present on Admission: . Acute metabolic encephalopathy . AKI (acute kidney injury) (HCC) . Urinary retention . BPH (benign prostatic hyperplasia) . Hypertension   I have reviewed the medical record, interviewed the patient and family, and examined the patient. The following aspects are pertinent.  Past Medical History:  Diagnosis Date  . BPH (benign prostatic hyperplasia)   . Bradycardia   . Coronary artery disease   . Enlarged prostate   . Hypertension   . Renal disorder   . Senile dementia [facility])    Social History   Socioeconomic History  . Marital status: Married    Spouse name: Not on  file  . Number of children: Not on file  . Years of education: Not on file  . Highest education level: Not on file  Occupational History  . Not on file  Tobacco Use  . Smoking status: Former Games developer  . Smokeless tobacco: Never Used  Substance and Sexual Activity  . Alcohol use: No  . Drug use: Not on file  . Sexual activity: Not on file  Other Topics Concern  . Not on file  Social History Narrative  . Not on file   Social Determinants of Health   Financial Resource Strain: Not on file  Food Insecurity: Not on file  Transportation Needs: Not on file  Physical Activity: Not on file  Stress: Not on file  Social Connections: Not on file   History reviewed. No pertinent family history. Scheduled Meds: . amLODipine  10 mg Oral Daily  . Chlorhexidine  Gluconate Cloth  6 each Topical Q0600  . feeding supplement  237 mL Oral TID BM  . finasteride  5 mg Oral Daily  . hydrALAZINE  25 mg Oral Q8H  . levETIRAcetam  500 mg Oral BID  . sertraline  75 mg Oral Daily  . tamsulosin  0.4 mg Oral QPC supper  . timolol  1 drop Both Eyes Daily   Continuous Infusions: . dextrose 5 % and 0.45% NaCl 75 mL/hr at 02/01/21 1309   PRN Meds:.acetaminophen **OR** acetaminophen Medications Prior to Admission:  Prior to Admission medications   Medication Sig Start Date End Date Taking? Authorizing Provider  atorvastatin (LIPITOR) 40 MG tablet Take 40 mg by mouth daily.   Yes [provider]  cholecalciferol (VITAMIN D) 400 UNITS TABS tablet Take 2,000 Units by mouth daily.   Yes [provider]  clobetasol cream (TEMOVATE) 0.05 % Apply 1 application topically 2 (two) times daily.   Yes [provider]  famotidine (PEPCID) 20 MG tablet Take 20 mg by mouth 2 (two) times daily. 01/07/21  Yes [provider]  finasteride (PROSCAR) 5 MG tablet Take 5 mg by mouth daily.   Yes [provider]  fluticasone (FLONASE) 50 MCG/ACT nasal spray Frequency:PRN   Dosage:50   MCG  Instructions:  Note:Dose: 02/26/13  Yes [provider]  gabapentin (NEURONTIN) 100 MG capsule Take 200 mg by mouth at bedtime. 01/26/21  Yes [provider]  Lactobacillus (ACIDOPHILUS) CAPS capsule Take by mouth. 03/06/11  Yes [provider]  levETIRAcetam (KEPPRA) 500 MG tablet Take 500 mg by mouth 2 (two) times daily. 12/09/16  Yes [provider]  lisinopril (PRINIVIL,ZESTRIL) 10 MG tablet Take 5 mg by mouth daily.   Yes [provider]  mometasone (ELOCON) 0.1 % ointment  03/04/20  Yes [provider]  ranitidine (ZANTAC) 150 MG tablet Take 150 mg by mouth 2 (two) times daily.   Yes [provider]  sertraline (ZOLOFT) 50 MG tablet Take 75 mg by mouth daily. 07/21/20 07/21/21 Yes [provider]  tamsulosin (FLOMAX) 0.4 MG CAPS capsule Take 0.4 mg by mouth daily.   Yes [provider]  timolol (TIMOPTIC) 0.5 % ophthalmic solution Place 1 drop into both eyes daily.   Yes [provider]  aspirin EC 81 MG tablet Take 81 mg by mouth daily. Patient not taking: No sig reported    [provider]   Allergies  Allergen Reactions  . Aspirin Other (See Comments)    Other reaction(s): Blood Disorder Intracranial bleed Intracranial bleed    Review  of Systems  Unable to perform ROS   Physical Exam Pulmonary:     Effort: Pulmonary effort is normal.  Neurological:     Mental Status: He is alert.     Vital Signs: BP (!) 146/78 (BP Location: Right Arm)   Pulse 69   Temp 97.8 F (36.6 C) (Oral)   Resp 18   Ht 6' (1.829 m)   Wt 65.5 kg   SpO2 97%   BMI 19.58 kg/m  Pain Scale: 0-10   Pain Score: Asleep   SpO2: SpO2: 97 % O2 Device:SpO2: 97 % O2 Flow Rate: .   IO: Intake/output summary:   Intake/Output Summary (Last 24 hours) at 02/01/2021 1501 Last data filed at 02/01/2021 1019 Gross per 24 hour  Intake 480 ml  Output 650 ml  Net -170 ml    LBM: Last BM Date: 01/31/21 Baseline Weight: Weight: 71.8 kg Most recent weight: Weight: 65.5 kg      Time In: 2:20 Time Out: 3:00 Time Total: 40 min Greater than 50%  of this time was spent counseling and coordinating care related to the above assessment and plan.  Signed by: Morton Stall, NP   Please contact Palliative Medicine Team phone at [phone] for questions and concerns.  For individual provider: See Loretha Stapler

## 2021-02-02 LAB — CBC WITH DIFFERENTIAL/PLATELET
Abs Immature Granulocytes: 0.04 10*3/uL (ref 0.00–0.07)
Basophils Absolute: 0.1 10*3/uL (ref 0.0–0.1)
Basophils Relative: 1 %
Eosinophils Absolute: 0.4 10*3/uL (ref 0.0–0.5)
Eosinophils Relative: 4 %
HCT: 36 % — ABNORMAL LOW (ref 39.0–52.0)
Hemoglobin: 12.9 g/dL — ABNORMAL LOW (ref 13.0–17.0)
Immature Granulocytes: 0 %
Lymphocytes Relative: 10 %
Lymphs Abs: 1 10*3/uL (ref 0.7–4.0)
MCH: 33.9 pg (ref 26.0–34.0)
MCHC: 35.8 g/dL (ref 30.0–36.0)
MCV: 94.7 fL (ref 80.0–100.0)
Monocytes Absolute: 1.1 10*3/uL — ABNORMAL HIGH (ref 0.1–1.0)
Monocytes Relative: 11 %
Neutro Abs: 7.2 10*3/uL (ref 1.7–7.7)
Neutrophils Relative %: 74 %
Platelets: 140 10*3/uL — ABNORMAL LOW (ref 150–400)
RBC: 3.8 MIL/uL — ABNORMAL LOW (ref 4.22–5.81)
RDW: 12.3 % (ref 11.5–15.5)
WBC: 9.8 10*3/uL (ref 4.0–10.5)
nRBC: 0 % (ref 0.0–0.2)

## 2021-02-02 LAB — BASIC METABOLIC PANEL
Anion gap: 9 (ref 5–15)
BUN: 42 mg/dL — ABNORMAL HIGH (ref 8–23)
CO2: 23 mmol/L (ref 22–32)
Calcium: 8.7 mg/dL — ABNORMAL LOW (ref 8.9–10.3)
Chloride: 102 mmol/L (ref 98–111)
Creatinine, Ser: 1.87 mg/dL — ABNORMAL HIGH (ref 0.61–1.24)
GFR, Estimated: 34 mL/min — ABNORMAL LOW (ref >60)
Glucose, Bld: 131 mg/dL — ABNORMAL HIGH (ref 70–99)
Potassium: 3.6 mmol/L (ref 3.5–5.1)
Sodium: 134 mmol/L — ABNORMAL LOW (ref 135–145)

## 2021-02-02 LAB — RESP PANEL BY RT-PCR (FLU A&B, COVID) ARPGX2
Influenza A by PCR: NEGATIVE
Influenza B by PCR: NEGATIVE
SARS Coronavirus 2 by RT PCR: NEGATIVE

## 2021-02-02 MED ORDER — HYDRALAZINE HCL 25 MG PO TABS: 25.0000 mg | ORAL_TABLET | Freq: Three times a day (TID) | ORAL | Status: DC

## 2021-02-02 MED ORDER — AMLODIPINE BESYLATE 10 MG PO TABS: 10.0000 mg | ORAL_TABLET | Freq: Every day | ORAL | Status: DC

## 2021-02-02 NOTE — Care Management (Signed)
Per epic records  Pfizer COVID-19 Vaccine 11/09/2020 , 03/02/2020 , 02/07/2020

## 2021-02-02 NOTE — Progress Notes (Addendum)
Report has been called to [place], DON at [facility] at [phone]. Misty Stanley will be the nurse accepting the patient today. Wife made aware that pt will be going to room 2B. Staff at [facility] was made aware that pt has a foley catheter in place and needs to follow up with urologist.

## 2021-02-02 NOTE — Discharge Summary (Signed)
Physician Discharge Summary  Andrew Bird NTI:[id number] DOB: 07/08/1928 DOA: 01/27/2021  PCP: Dione Housekeeper, MD  Admit date: 01/27/2021 Discharge date: 02/02/2021  Admitted From: Home Disposition:  SNF  Recommendations for Outpatient Follow-up:  1. Follow up with PCP in 1-2 weeks 2. Follow-up urology 2 to 3 days post discharge  Home Health: No Equipment/Devices: Foley catheter Discharge Condition: Stable CODE STATUS: Full Diet recommendation: Dysphagia   Brief/Interim Summary: 87 y.o.malewith medical history significant forhypertension, hyperlipidemia,BPH,subdural hematoma requiring bur hole in November 2016, dementia,who is admitted to [facility] on 2/4/2022with acute metabolic encephalopathyafter presenting from home to [facility] Emergency Department for evaluation of altered mental status.Noncontrast CT of the head showedno evidence of acute intracranial process, including no evidence of intracranial hemorrhage, while showingdiffuse cortical atrophy.Postvoid residual scan showed greater than 450 cc of urine. While in the ED,the patient was noted to be agitated,attempting to pull out IVs. Consequently, he received Haldol2 mg IV x1 initially.He also received a 1 L lactated Ringer bolus.  Baseline mental status is unclear however patient has not been terribly interactive.  At times somnolent.  Initial recommendation was for skilled nursing facility.  Patient family initially refused wanting to take him home however reconsidered and now is agreeable to SNF placement.  Urology consulted for urinary retention.  Foley catheter placed with plans to discharge with Foley in place and follow-up with urology 7 days post discharge for voiding trial.  On day of discharge patient is hemodynamically stable.  Mental status remains somewhat poor.  Patient's wife is agreed to skilled nursing facility placement.  TOC aware and was able to find a bed.  Sitter was  discontinued on 02/01/2021 and patient remained stable without agitation overnight.  Discharge Diagnoses:  Principal Problem:   Acute metabolic encephalopathy Active Problems:   AKI (acute kidney injury) (HCC)   Urinary retention   BPH (benign prostatic hyperplasia)   Hypertension   Protein-calorie malnutrition, severe  Acute metabolic encephalopathy Unclear etiology Not likely infectious Likely secondary to urinary retention and acute kidney injury Possible medication effect as well Improved at time of discharge but baseline unclear Discontinue home gabapentin at time of discharge Avoid sedatives and anticholinergics Frequent reorienting measures Delirium precautions. Referral for outpatient palliative care  Acute kidney injury superimposed on CKD stage IIIa Presented with a creatinine of 2.06  Had post void residual greater than 450 cc in ED  Baseline 1.5-1.9 Has history of BPH 2/8 Foley in place Creatinine has improved and is at baseline.  Avoid nephrotoxins  Urinary retention In the context of a documented history of BPH elevated postvoid residual in the ED Continue Flomax and Proscar  Urology was following. Recommended keeping indwelling Foley for 7 days and follow-up with urology as outpatient  Patient can follow-up with Dr. Lonna Cobb inpatient [facility] consultant or his own established urologist.  Dr. Lonna Cobb office phone number and follow-up instructions included in discharge packet  Essential hypertension Labile at times Continue amlodipine 10 mg daily Continue hydralazine 25 mg 3 times daily  Hyperlipidemia statin  Discharge Instructions  Discharge Instructions    Diet - low sodium heart healthy   Complete by: As directed    Increase activity slowly   Complete by: As directed      Allergies as of 02/02/2021      Reactions   Aspirin Other (See Comments)   Other reaction(s): Blood Disorder Intracranial bleed Intracranial bleed      Medication  List    STOP taking these medications  aspirin EC 81 MG tablet   gabapentin 100 MG capsule Commonly known as: NEURONTIN     TAKE these medications   Acidophilus Caps capsule Take by mouth.   amLODipine 10 MG tablet Commonly known as: NORVASC Take 1 tablet (10 mg total) by mouth daily. Start taking on: February 03, 2021   atorvastatin 40 MG tablet Commonly known as: LIPITOR Take 40 mg by mouth daily.   cholecalciferol 10 MCG (400 UNIT) Tabs tablet Commonly known as: VITAMIN D3 Take 2,000 Units by mouth daily.   clobetasol cream 0.05 % Commonly known as: TEMOVATE Apply 1 application topically 2 (two) times daily.   famotidine 20 MG tablet Commonly known as: PEPCID Take 20 mg by mouth 2 (two) times daily.   finasteride 5 MG tablet Commonly known as: PROSCAR Take 5 mg by mouth daily.   fluticasone 50 MCG/ACT nasal spray Commonly known as: FLONASE Frequency:PRN   Dosage:50   MCG  Instructions:  Note:Dose:   hydrALAZINE 25 MG tablet Commonly known as: APRESOLINE Take 1 tablet (25 mg total) by mouth every 8 (eight) hours.   levETIRAcetam 500 MG tablet Commonly known as: KEPPRA Take 500 mg by mouth 2 (two) times daily.   lisinopril 10 MG tablet Commonly known as: ZESTRIL Take 5 mg by mouth daily.   mometasone 0.1 % ointment Commonly known as: ELOCON   ranitidine 150 MG tablet Commonly known as: ZANTAC Take 150 mg by mouth 2 (two) times daily.   sertraline 50 MG tablet Commonly known as: ZOLOFT Take 75 mg by mouth daily.   tamsulosin 0.4 MG Caps capsule Commonly known as: FLOMAX Take 0.4 mg by mouth daily.   timolol 0.5 % ophthalmic solution Commonly known as: TIMOPTIC Place 1 drop into both eyes daily.            Durable Medical Equipment  (From admission, onward)         Start     Ordered   01/31/21 1516  For home use only DME Hospital bed  Once       Question Answer Comment  Length of Need Lifetime   Patient has (list medical  condition): dementia   The above medical condition requires: Patient requires the ability to reposition frequently   Head must be elevated greater than: 45 degrees   Bed type Semi-electric   Trapeze Bar Yes   Support Surface: Gel Overlay      01/31/21 1516   01/31/21 1516  For home use only DME lightweight manual wheelchair with seat cushion  Once       Comments: Patient suffers from dementia  which impairs their ability to perform daily activities like ambulate in the home.  A walker will not resolve  issue with performing activities of daily living. A wheelchair will allow patient to safely perform daily activities. Patient is not able to propel themselves in the home using a standard weight wheelchair due to {weakness Patient can self propel in the lightweight wheelchair. Length of need life time  Accessories: elevating leg rests (ELRs), wheel locks, extensions and anti-tippers.   01/31/21 1516          Contact information for follow-up providers    Stoioff, Verna Czech, MD. Schedule an appointment as soon as possible for a visit.   Specialty: Urology Why: Please follow up with Dr. Lonna Cobb from urology for evaluation and possible removal of your foley catheter.  If you have an established urologist you can also follow up with them.  This catheter was placed 01/28/21.  Please try to follow up within 3 days Contact information: [address] [place] [address] [phone]            Contact information for after-discharge care    Destination    [facility] CARE Preferred SNF .   Service: Skilled Nursing Contact information: [address] [place] [address] [phone]                 Allergies  Allergen Reactions  . Aspirin Other (See Comments)    Other reaction(s): Blood Disorder Intracranial bleed Intracranial bleed     Consultations:  Urology  Palliative care   Procedures/Studies: DG Chest 2 View  Result  Date: 01/27/2021 CLINICAL DATA:  Syncope, altered mental status EXAM: CHEST - 2 VIEW COMPARISON:  01/27/2021 FINDINGS: Bilateral chronic interstitial thickening. No focal consolidation. No pleural effusion or pneumothorax. Heart and mediastinal contours are unremarkable. Severe osteoarthritis of the left glenohumeral joint. IMPRESSION: No active cardiopulmonary disease. Electronically Signed   By: Elige Ko   On: 01/27/2021 12:48   CT Head Wo Contrast  Result Date: 01/27/2021 CLINICAL DATA:  Altered mental status. EXAM: CT HEAD WITHOUT CONTRAST TECHNIQUE: Contiguous axial images were obtained from the base of the skull through the vertex without intravenous contrast. COMPARISON:  November 21, 2015. FINDINGS: Brain: Mild diffuse cortical atrophy is noted. No mass effect or midline shift is noted. Ventricular size is within normal limits. There is no evidence of mass lesion, hemorrhage or acute infarction. Vascular: No hyperdense vessel or unexpected calcification. Skull: Status post left craniotomy.  No acute abnormality is noted. Sinuses/Orbits: No acute finding. Other: None. IMPRESSION: Mild diffuse cortical atrophy. No acute intracranial abnormality seen. Electronically Signed   By: Lupita Raider M.D.   On: 01/27/2021 12:14    (Echo, Carotid, EGD, Colonoscopy, ERCP)    Subjective: Patient seen and examined at the time of discharge.  Stable, no distress.  Minimally communicative.  Discharge Exam: Vitals:   02/02/21 0757 02/02/21 1146  BP: 138/76 137/75  Pulse: 66 67  Resp:  16  Temp: 98.7 F (37.1 C) 98.4 F (36.9 C)  SpO2: 97% 96%   Vitals:   02/02/21 0326 02/02/21 0500 02/02/21 0757 02/02/21 1146  BP: (!) 152/89  138/76 137/75  Pulse: 84  66 67  Resp: 18   16  Temp: 98.2 F (36.8 C)  98.7 F (37.1 C) 98.4 F (36.9 C)  TempSrc:    Oral  SpO2: 100%  97% 96%  Weight:  74.9 kg    Height:        General: Patient is awake, minimally communicative, no visible distress, appears  chronically ill Cardiovascular: RRR, S1/S2 +, no rubs, no gallops Respiratory: CTA bilaterally, no wheezing, no rhonchi Abdominal: Soft, NT, ND, bowel sounds + Extremities: no edema, no cyanosis    The results of significant diagnostics from this hospitalization (including imaging, microbiology, ancillary and laboratory) are listed below for reference.     Microbiology: Recent Results (from the past 240 hour(s))  SARS Coronavirus 2 by RT PCR (hospital order, performed in [facility] hospital lab) Nasopharyngeal Nasopharyngeal Swab     Status: None   Collection Time: 01/27/21 11:51 AM   Specimen: Nasopharyngeal Swab  Result Value Ref Range Status   SARS Coronavirus 2 NEGATIVE NEGATIVE Final    Comment: (NOTE) SARS-CoV-2 target nucleic acids are NOT DETECTED.  The SARS-CoV-2 RNA is generally detectable in upper and lower  respiratory specimens during the acute phase of infection. The lowest concentration of SARS-CoV-2 viral copies this assay can detect is 250 copies / mL. A negative result does not preclude SARS-CoV-2 infection and should not be used as the sole basis for treatment or other patient management decisions.  A negative result may occur with improper specimen collection / handling, submission of specimen other than nasopharyngeal swab, presence of viral mutation(s) within the areas targeted by this assay, and inadequate number of viral copies (<250 copies / mL). A negative result must be combined with clinical observations, patient history, and epidemiological information.  Fact Sheet for Patients:   BoilerBrush.com.cy  Fact Sheet for Healthcare Providers: https://pope.com/  This test is not yet approved or  cleared by the [place] FDA and has been authorized for detection and/or diagnosis of SARS-CoV-2 by FDA under an Emergency Use Authorization (EUA).  This EUA will remain in effect (meaning this test can be  used) for the duration of the COVID-19 declaration under Section 564(b)(1) of the Act, 21 U.S.C. section 360bbb-3(b)(1), unless the authorization is terminated or revoked sooner.  Performed at [facility] [address] [place], [place] [address]   Resp Panel by RT-PCR (Flu A&B, Covid) Nasopharyngeal Swab     Status: None   Collection Time: 02/02/21 10:18 AM   Specimen: Nasopharyngeal Swab; Nasopharyngeal(NP) swabs in vial transport medium  Result Value Ref Range Status   SARS Coronavirus 2 by RT PCR NEGATIVE NEGATIVE Final    Comment: (NOTE) SARS-CoV-2 target nucleic acids are NOT DETECTED.  The SARS-CoV-2 RNA is generally detectable in upper respiratory specimens during the acute phase of infection. The lowest concentration of SARS-CoV-2 viral copies this assay can detect is 138 copies/mL. A negative result does not preclude SARS-Cov-2 infection and should not be used as the sole basis for treatment or other patient management decisions. A negative result may occur with  improper specimen collection/handling, submission of specimen other than nasopharyngeal swab, presence of viral mutation(s) within the areas targeted by this assay, and inadequate number of viral copies(<138 copies/mL). A negative result must be combined with clinical observations, patient history, and epidemiological information. The expected result is Negative.  Fact Sheet for Patients:  BloggerCourse.com  Fact Sheet for Healthcare Providers:  SeriousBroker.it  This test is no t yet approved or cleared by the [place] FDA and  has been authorized for detection and/or diagnosis of SARS-CoV-2 by FDA under an Emergency Use Authorization (EUA). This EUA will remain  in effect (meaning this test can be used) for the duration of the COVID-19 declaration under Section 564(b)(1) of the Act, 21 U.S.C.section 360bbb-3(b)(1), unless the authorization  is terminated  or revoked sooner.       Influenza A by PCR NEGATIVE NEGATIVE Final   Influenza B by PCR NEGATIVE NEGATIVE Final    Comment: (NOTE) The Xpert Xpress SARS-CoV-2/FLU/RSV plus assay is intended as an aid in the diagnosis of influenza from Nasopharyngeal swab specimens and should not be used as a sole basis for treatment. Nasal washings and aspirates are unacceptable for Xpert Xpress SARS-CoV-2/FLU/RSV testing.  Fact Sheet for Patients: BloggerCourse.com  Fact Sheet for Healthcare Providers: SeriousBroker.it  This test is not yet approved or cleared by the [place] FDA and has been authorized for detection and/or diagnosis of SARS-CoV-2 by FDA under an Emergency Use Authorization (EUA). This EUA will remain in effect (meaning this test can be used) for the duration of the COVID-19 declaration under Section 564(b)(1) of  the Act, 21 U.S.C. section 360bbb-3(b)(1), unless the authorization is terminated or revoked.  Performed at [facility], [address] [place], [place] [address]      Labs: BNP (last 3 results) No results for input(s): BNP in the last 8760 hours. Basic Metabolic Panel: Recent Labs  Lab 01/27/21 1145 01/28/21 0825 01/29/21 0604 01/30/21 0419 02/01/21 0810 02/02/21 0505  NA 140 139 135  --  135 134*  K 4.3 4.4 4.0  --  4.0 3.6  CL 107 106 105  --  106 102  CO2 23 23 20*  --  21* 23  GLUCOSE 99 84 121*  --  130* 131*  BUN 43* 38* 39* 36* 33* 42*  CREATININE 2.06* 1.83* 1.96* 1.81* 1.87* 1.87*  CALCIUM 9.1 9.4 9.1  --  8.6* 8.7*  MG  --  1.8  --   --   --   --    Liver Function Tests: Recent Labs  Lab 01/27/21 1145 01/28/21 0825  AST 20 25  ALT 16 17  ALKPHOS 58 61  BILITOT 1.1 1.5*  PROT 6.6 6.2*  ALBUMIN 3.9 3.8   No results for input(s): LIPASE, AMYLASE in the last 168 hours. Recent Labs  Lab 01/27/21 1152  AMMONIA 23   CBC: Recent Labs  Lab  01/27/21 1145 01/28/21 0825 02/01/21 0439 02/02/21 0505  WBC 6.3 7.6 10.6* 9.8  NEUTROABS 4.0  --   --  7.2  HGB 13.1 13.7 13.0 12.9*  HCT 39.0 40.6 37.1* 36.0*  MCV 99.5 98.1 95.1 94.7  PLT 143* 134* 139* 140*   Cardiac Enzymes: No results for input(s): CKTOTAL, CKMB, CKMBINDEX, TROPONINI in the last 168 hours. BNP: Invalid input(s): POCBNP CBG: No results for input(s): GLUCAP in the last 168 hours. D-Dimer No results for input(s): DDIMER in the last 72 hours. Hgb A1c No results for input(s): HGBA1C in the last 72 hours. Lipid Profile No results for input(s): CHOL, HDL, LDLCALC, TRIG, CHOLHDL, LDLDIRECT in the last 72 hours. Thyroid function studies No results for input(s): TSH, T4TOTAL, T3FREE, THYROIDAB in the last 72 hours.  Invalid input(s): FREET3 Anemia work up No results for input(s): VITAMINB12, FOLATE, FERRITIN, TIBC, IRON, RETICCTPCT in the last 72 hours. Urinalysis    Component Value Date/Time   COLORURINE YELLOW (A) 01/27/2021 1309   APPEARANCEUR CLEAR (A) 01/27/2021 1309   LABSPEC 1.018 01/27/2021 1309   PHURINE 6.0 01/27/2021 1309   GLUCOSEU NEGATIVE 01/27/2021 1309   HGBUR NEGATIVE 01/27/2021 1309   BILIRUBINUR NEGATIVE 01/27/2021 1309   KETONESUR NEGATIVE 01/27/2021 1309   PROTEINUR NEGATIVE 01/27/2021 1309   NITRITE NEGATIVE 01/27/2021 1309   LEUKOCYTESUR NEGATIVE 01/27/2021 1309   Sepsis Labs Invalid input(s): PROCALCITONIN,  WBC,  LACTICIDVEN Microbiology Recent Results (from the past 240 hour(s))  SARS Coronavirus 2 by RT PCR (hospital order, performed in [facility] hospital lab) Nasopharyngeal Nasopharyngeal Swab     Status: None   Collection Time: 01/27/21 11:51 AM   Specimen: Nasopharyngeal Swab  Result Value Ref Range Status   SARS Coronavirus 2 NEGATIVE NEGATIVE Final    Comment: (NOTE) SARS-CoV-2 target nucleic acids are NOT DETECTED.  The SARS-CoV-2 RNA is generally detectable in upper and lower respiratory specimens during the  acute phase of infection. The lowest concentration of SARS-CoV-2 viral copies this assay can detect is 250 copies / mL. A negative result does not preclude SARS-CoV-2 infection and should not be used as the sole basis for treatment or other patient management decisions.  A negative result may occur with improper specimen collection / handling, submission of specimen other than nasopharyngeal swab, presence of viral mutation(s) within the areas targeted by this assay, and inadequate number of viral copies (<250 copies / mL). A negative result must be combined with clinical observations, patient history, and epidemiological information.  Fact Sheet for Patients:   BoilerBrush.com.cy  Fact Sheet for Healthcare Providers: https://pope.com/  This test is not yet approved or  cleared by the [place] FDA and has been authorized for detection and/or diagnosis of SARS-CoV-2 by FDA under an Emergency Use Authorization (EUA).  This EUA will remain in effect (meaning this test can be used) for the duration of the COVID-19 declaration under Section 564(b)(1) of the Act, 21 U.S.C. section 360bbb-3(b)(1), unless the authorization is terminated or revoked sooner.  Performed at [facility], [address] [place], [place] [address]   Resp Panel by RT-PCR (Flu A&B, Covid) Nasopharyngeal Swab     Status: None   Collection Time: 02/02/21 10:18 AM   Specimen: Nasopharyngeal Swab; Nasopharyngeal(NP) swabs in vial transport medium  Result Value Ref Range Status   SARS Coronavirus 2 by RT PCR NEGATIVE NEGATIVE Final    Comment: (NOTE) SARS-CoV-2 target nucleic acids are NOT DETECTED.  The SARS-CoV-2 RNA is generally detectable in upper respiratory specimens during the acute phase of infection. The lowest concentration of SARS-CoV-2 viral copies this assay can detect is 138 copies/mL. A negative result does not preclude  SARS-Cov-2 infection and should not be used as the sole basis for treatment or other patient management decisions. A negative result may occur with  improper specimen collection/handling, submission of specimen other than nasopharyngeal swab, presence of viral mutation(s) within the areas targeted by this assay, and inadequate number of viral copies(<138 copies/mL). A negative result must be combined with clinical observations, patient history, and epidemiological information. The expected result is Negative.  Fact Sheet for Patients:  BloggerCourse.com  Fact Sheet for Healthcare Providers:  SeriousBroker.it  This test is no t yet approved or cleared by the [place] FDA and  has been authorized for detection and/or diagnosis of SARS-CoV-2 by FDA under an Emergency Use Authorization (EUA). This EUA will remain  in effect (meaning this test can be used) for the duration of the COVID-19 declaration under Section 564(b)(1) of the Act, 21 U.S.C.section 360bbb-3(b)(1), unless the authorization is terminated  or revoked sooner.       Influenza A by PCR NEGATIVE NEGATIVE Final   Influenza B by PCR NEGATIVE NEGATIVE Final    Comment: (NOTE) The Xpert Xpress SARS-CoV-2/FLU/RSV plus assay is intended as an aid in the diagnosis of influenza from Nasopharyngeal swab specimens and should not be used as a sole basis for treatment. Nasal washings and aspirates are unacceptable for Xpert Xpress SARS-CoV-2/FLU/RSV testing.  Fact Sheet for Patients: BloggerCourse.com  Fact Sheet for Healthcare Providers: SeriousBroker.it  This test is not yet approved or cleared by the [place] FDA and has been authorized for detection and/or diagnosis of SARS-CoV-2 by FDA under an Emergency Use Authorization (EUA). This EUA will remain in effect (meaning this test can be used) for the duration of  the COVID-19 declaration under Section 564(b)(1) of the Act, 21 U.S.C. section 360bbb-3(b)(1), unless the authorization is terminated or revoked.  Performed at [facility], [address] [place], [place] [address]      Time coordinating discharge: Over 30 minutes  SIGNED:   Tresa Moore, MD  [facility] 02/02/2021,  1:58 PM Pager   If 7PM-7AM, please contact night-coverage

## 2021-02-02 NOTE — Progress Notes (Signed)
[facility] Room 221 [facility]) Hospital Liaison RN note:  Received new referral for [facility] out patient palliative program to follow post discharge from Dr. Georgeann Oppenheim and Bevelyn Ngo, TOC. Patient information given to referral. Plan is for discharge today to [facility].  Thank you for this referral.  Cyndra Numbers, RN [facility] Liaison  [phone]

## 2021-02-02 NOTE — Progress Notes (Signed)
EMS here to transport pt to [facility].

## 2021-02-02 NOTE — Care Management Important Message (Signed)
Important Message  Patient Details  Name: Andrew Bird MRN: [id number] Date of Birth: 01-11-1928   Medicare Important Message Given:  Yes     [name] 02/02/2021, 2:35 PM

## 2021-02-02 NOTE — TOC Transition Note (Addendum)
Transition of Care [facility]) - CM/SW Discharge Note   Patient Details  Name: Andrew Bird MRN: [id number] Date of Birth: [birth year]  [facility] CM/SW Contact:  Chapman Fitch, RN Phone Number: 02/02/2021, 1:28 PM   Clinical Narrative:    Patient to discharge to [facility] today Wife at bedside  Repeat covid test negative EMS packet on chart  Beside RN to call report EMS transportation arranged  DC info sent in the hub Wife updated   Outpatient palliative referral made to [facility] with Civil engineer, contracting   Final next level of care: Skilled Nursing Facility Barriers to Discharge: No Barriers Identified   Patient Goals and CMS Choice        Discharge Placement              Patient chooses bed at: [facility] Patient to be transferred to facility by: EMS Name of family member notified: wife Patient and family notified of of transfer: 02/02/21  Discharge Plan and Services   Discharge Planning Services: CM Consult                      HH Arranged: RN,PT,OT,Nurse's Aide,Speech Therapy,Social Work [facility] Agency: [facility] Date HH Agency Contacted: 01/30/21   Representative spoke with at [facility] Agency: Barbara Cower  Social Determinants of Health (SDOH) Interventions     Readmission Risk Interventions No flowsheet data found.

## 2021-02-02 NOTE — Progress Notes (Signed)
  Speech Language Pathology Treatment: Dysphagia  Patient Details Name: Andrew Bird MRN: [id number] DOB: [birth year] Today's Date: 02/02/2021 Time: 1340-1405 SLP Time Calculation (min) (ACUTE ONLY): 25 min  Assessment / Plan / Recommendation Clinical Impression  Pt seen at bedside for assessment of diet tolerance and continued education. Pt is being discharged to SNF today. Wife present at bedside. Palliative NP indicated concerns for pt's tolerance of dys 2 textures, including pocketing and anterior leakage. Trials of thin liquid via straw resulted in intermittent cough. Cup sips appeared to be tolerated well. Will downgrade diet to puree and thin liquid via CUP (NO STRAWS) to maximize safety. Pt may benefit from follow up with speech therapy at SNF for education to minimize aspiration risk.   HPI HPI: Pt  is a 87 y.o. male with past medical history of Dementia, CAD, HTN, BPH, arthritis, seizure disorder, CKD, and intracranial hemorrhage in 2006 who presents via EMS from clinic for evaluation of altered mental status.  History is very limited from the patient secondary to his dementia and altered mental status.  Per his wife, pt has memory difficulty secondary to Dementia at baseline but seemed significantly more confused over the last 3 days has been speaking in gibberish putting on several layers of clothing when was inappropriate. He layed around in bed a couple of days but did not seek care until seeing his PCP in clinic the day of admit.  She denies that he has had any witnessed falls, cough, vomiting, diarrhea, dysuria or any other recent sick symptoms as far she can tell.  No clear precipitating events.  She does note that at baseline he is weak and has left leg has some weakness in his right leg and has to hold onto objects in the house to get around; feeds self w/ both hands holding the Cup.   Chest x-ray showed No evidence of acute cardiopulmonary process, including no evidence of  infiltrate, edema, effusion, or pneumothorax.  Noncontrast CT of the head showed No evidence of acute intracranial process, including no evidence of intracranial hemorrhage, while showing diffuse cortical atrophy.  Admitting dx: Acute metabolic encephalopathy w/ AKI and urinary retention.      SLP Plan  Continue with current plan of care       Recommendations  Diet recommendations: Dysphagia 1 (puree);Thin liquid Liquids provided via: Cup;No straw Medication Administration: Crushed with puree Supervision: Staff to assist with self feeding;Full supervision/cueing for compensatory strategies Compensations: Minimize environmental distractions;Slow rate;Small sips/bites;Lingual sweep for clearance of pocketing;Multiple dry swallows after each bite/sip;Follow solids with liquid Postural Changes and/or Swallow Maneuvers: Seated upright 90 degrees;Upright 30-60 min after meal                Oral Care Recommendations: Oral care BID;Oral care before and after PO;Staff/trained caregiver to provide oral care Follow up Recommendations: Skilled Nursing facility SLP Visit Diagnosis: Dysphagia, oropharyngeal phase (R13.12) Plan: Continue with current plan of care       GO               Janaisa Birkland B. Murvin Natal [facility], CCC-SLP Speech Language Pathologist [phone]  Leigh Aurora 02/02/2021, 2:06 PM

## 2021-02-02 NOTE — TOC Progression Note (Signed)
Transition of Care [facility]) - Progression Note    Patient Details  Name: BOAZ BERISHA MRN: [id number] Date of Birth: 05/02/1928  Transition of Care [facility] CM/SW Contact  Chapman Fitch, RN Phone Number: 02/02/2021, 10:38 AM  Clinical Narrative:     Bed offer presented.  Wife accepted by at [facility]   Expected Discharge Plan: Home w Home Health Services Barriers to Discharge: Continued Medical Work up  Expected Discharge Plan and Services Expected Discharge Plan: Home w Home Health Services   Discharge Planning Services: CM Consult   Living arrangements for the past 2 months: Single Family Home                           HH Arranged: RN,PT,OT,Nurse's Aide,Speech Therapy,Social Work [facility] Agency: [facility] Date HH Agency Contacted: 01/30/21   Representative spoke with at [facility] Agency: Barbara Cower   Social Determinants of Health (SDOH) Interventions    Readmission Risk Interventions No flowsheet data found.

## 2021-02-02 NOTE — Progress Notes (Signed)
Daily Progress Note   Patient Name: Andrew Bird       Date: 02/02/2021 DOB: 15-Dec-1928  Age: 87 y.o. MRN#: [id number] Attending Physician: Tresa Moore, MD Primary Care Physician: Dione Housekeeper, MD Admit Date: 01/27/2021  Reason for Consultation/Follow-up: Establishing goals of care  Subjective: Patient is resting in bed with wife feeding him. Wife states that he did not eat breakfast, and has not eaten well for lunch. She states that she is concerned that he is 87 years old, may not do well in SNF, and may not recover from this. She has questions about SNF placement, and questions about hospice facility, and hospice at their home. Discussed palliative and hospice philosophy and also discussed transition from palliative medicine to hospice. All questions answered. Discussed that that I had received a message (via epic chat) that her daughter wanted to speak to me. She states that she is not sure how her daughter found out about our conversation, and was upset that someone told her. She called daughter Andrew Bird to ask if by chance that she was the one that had reached out to me. Andrew Bird said she was not. Diagnoses prognosis and status was discussed with Andrew Bird at wife's request. They discussed that they would like for me to speak with Andrew Bird, as Andrew Bird is wanting full code full scope. Patient's wife attempted to call Andrew Bird on her phone using speaker phone however she did not answer. Wife requested that I leave the voicemail. I left a voicemail at that time. Recommend outpatient palliative with transition to hospice when wife is ready.  Wife did state that she wanted to complete a MOST form regardless of her children's feelings. She states that she watched her sister suffer and she does not want to  prolong her husband's life and suffering. She states that she would want him brought back to the hospital for now, and would want antibiotics. She would not want CPR or breathing tube. She also would not want IV fluids or feeding tube to sustain patient if he has inadequate oral intake. MOST form completed with her per her wishes.  I completed a MOST form today with patient's wife, and the signed original was emailed to be scanned into [facility], and a copy placed in the chart. A photocopy was placed in the chart to  be scanned into EMR. The patient outlined their wishes for the following treatment decisions:  Cardiopulmonary Resuscitation: Do Not Attempt Resuscitation (DNR/No CPR)  Medical Interventions: Limited Additional Interventions: Use medical treatment, IV fluids and cardiac monitoring as indicated, DO NOT USE intubation or mechanical ventilation. May consider use of less invasive airway support such as BiPAP or CPAP. Also provide comfort measures. Transfer to the hospital if indicated. Avoid intensive care.   Antibiotics: Antibiotics if indicated  IV Fluids: No IV fluids (provide other measures to ensure comfort)  Feeding Tube: No feeding tube    Length of Stay: 6  Current Medications: Scheduled Meds:  . amLODipine  10 mg Oral Daily  . Chlorhexidine Gluconate Cloth  6 each Topical Q0600  . feeding supplement  237 mL Oral TID BM  . finasteride  5 mg Oral Daily  . hydrALAZINE  25 mg Oral Q8H  . levETIRAcetam  500 mg Oral BID  . sertraline  75 mg Oral Daily  . tamsulosin  0.4 mg Oral QPC supper  . timolol  1 drop Both Eyes Daily    Continuous Infusions:   PRN Meds: acetaminophen **OR** acetaminophen  Physical Exam Pulmonary:     Effort: Pulmonary effort is normal.  Neurological:     Mental Status: He is alert.             Vital Signs: BP 137/75 (BP Location: Left Arm)   Pulse 67   Temp 98.4 F (36.9 C) (Oral)   Resp 16   Ht 6' (1.829 m)   Wt 74.9 kg   SpO2 96%   BMI  22.39 kg/m  SpO2: SpO2: 96 % O2 Device: O2 Device: Room Air O2 Flow Rate:    Intake/output summary:   Intake/Output Summary (Last 24 hours) at 02/02/2021 1540 Last data filed at 02/02/2021 0900 Gross per 24 hour  Intake 240 ml  Output 1500 ml  Net -1260 ml   LBM: Last BM Date: 02/02/21 Baseline Weight: Weight: 71.8 kg Most recent weight: Weight: 74.9 kg         Patient Active Problem List   Diagnosis Date Noted  . Protein-calorie malnutrition, severe 02/01/2021  . AKI (acute kidney injury) (HCC) 01/28/2021  . Urinary retention 01/28/2021  . BPH (benign prostatic hyperplasia)   . Hypertension   . Acute metabolic encephalopathy 01/27/2021    Palliative Care Assessment & Plan    Recommendations/Plan:  DNR/DNI.   Palliative at discharge   Code Status:    Code Status Orders  (From admission, onward)         Start     Ordered   02/02/21 1423  Do not attempt resuscitation (DNR)  Continuous       Question Answer Comment  In the event of cardiac or respiratory ARREST Do not call a "code blue"   In the event of cardiac or respiratory ARREST Do not perform Intubation, CPR, defibrillation or ACLS   In the event of cardiac or respiratory ARREST Use medication by any route, position, wound care, and other measures to relive pain and suffering. May use oxygen, suction and manual treatment of airway obstruction as needed for comfort.   Comments MOST form on chart.      02/02/21 1423        Code Status History    Date Active Date Inactive Code Status Order ID Comments User Context   01/27/2021 1647 02/02/2021 1423 Full Code [id number]  Howerter, Chaney Born, DO ED   Advance Care Planning  Activity       Prognosis:   < 6 months   Care plan was discussed with MD and Case Manager via epic  Thank you for allowing the Palliative Medicine Team to assist in the care of this patient.   Time In: 1:00 Time Out: 2:00 Total Time 60 min Prolonged Time Billed  no       Greater than 50%  of this time was spent counseling and coordinating care related to the above assessment and plan.  Morton Stall, NP  Please contact Palliative Medicine Team phone at [phone] for questions and concerns.

## 2021-02-05 ENCOUNTER — Emergency Department: Payer: Medicare Other

## 2021-02-05 ENCOUNTER — Encounter: Payer: Self-pay | Admitting: Physician Assistant

## 2021-02-05 ENCOUNTER — Inpatient Hospital Stay
Admission: EM | Admit: 2021-02-05 | Discharge: 2021-02-07 | DRG: 698 | Disposition: A | Discharge status: Hospice | Payer: Medicare Other | Attending: Internal Medicine | Admitting: Internal Medicine

## 2021-02-05 ENCOUNTER — Observation Stay: Payer: Medicare Other

## 2021-02-05 ENCOUNTER — Other Ambulatory Visit: Payer: Self-pay

## 2021-02-05 DIAGNOSIS — R6521 Severe sepsis with septic shock: Secondary | ICD-10-CM | POA: Diagnosis not present

## 2021-02-05 DIAGNOSIS — Z955 Presence of coronary angioplasty implant and graft: Secondary | ICD-10-CM

## 2021-02-05 DIAGNOSIS — B9620 Unspecified Escherichia coli [E. coli] as the cause of diseases classified elsewhere: Secondary | ICD-10-CM | POA: Diagnosis present

## 2021-02-05 DIAGNOSIS — N1832 Chronic kidney disease, stage 3b: Secondary | ICD-10-CM | POA: Diagnosis present

## 2021-02-05 DIAGNOSIS — Z20822 Contact with and (suspected) exposure to covid-19: Secondary | ICD-10-CM | POA: Diagnosis present

## 2021-02-05 DIAGNOSIS — N179 Acute kidney failure, unspecified: Secondary | ICD-10-CM | POA: Diagnosis not present

## 2021-02-05 DIAGNOSIS — Z87891 Personal history of nicotine dependence: Secondary | ICD-10-CM

## 2021-02-05 DIAGNOSIS — Y738 Miscellaneous gastroenterology and urology devices associated with adverse incidents, not elsewhere classified: Secondary | ICD-10-CM | POA: Diagnosis present

## 2021-02-05 DIAGNOSIS — N401 Enlarged prostate with lower urinary tract symptoms: Secondary | ICD-10-CM | POA: Diagnosis present

## 2021-02-05 DIAGNOSIS — A419 Sepsis, unspecified organism: Secondary | ICD-10-CM | POA: Diagnosis present

## 2021-02-05 DIAGNOSIS — R471 Dysarthria and anarthria: Secondary | ICD-10-CM | POA: Diagnosis present

## 2021-02-05 DIAGNOSIS — G9341 Metabolic encephalopathy: Secondary | ICD-10-CM | POA: Diagnosis not present

## 2021-02-05 DIAGNOSIS — I129 Hypertensive chronic kidney disease with stage 1 through stage 4 chronic kidney disease, or unspecified chronic kidney disease: Secondary | ICD-10-CM | POA: Diagnosis present

## 2021-02-05 DIAGNOSIS — T83091A Other mechanical complication of indwelling urethral catheter, initial encounter: Secondary | ICD-10-CM | POA: Diagnosis not present

## 2021-02-05 DIAGNOSIS — E871 Hypo-osmolality and hyponatremia: Secondary | ICD-10-CM | POA: Diagnosis present

## 2021-02-05 DIAGNOSIS — N390 Urinary tract infection, site not specified: Secondary | ICD-10-CM

## 2021-02-05 DIAGNOSIS — I10 Essential (primary) hypertension: Secondary | ICD-10-CM | POA: Diagnosis not present

## 2021-02-05 DIAGNOSIS — I2510 Atherosclerotic heart disease of native coronary artery without angina pectoris: Secondary | ICD-10-CM | POA: Diagnosis present

## 2021-02-05 DIAGNOSIS — I7143 Infrarenal abdominal aortic aneurysm, without rupture: Secondary | ICD-10-CM | POA: Diagnosis present

## 2021-02-05 DIAGNOSIS — R310 Gross hematuria: Secondary | ICD-10-CM

## 2021-02-05 DIAGNOSIS — I714 Abdominal aortic aneurysm, without rupture: Secondary | ICD-10-CM | POA: Diagnosis present

## 2021-02-05 DIAGNOSIS — T839XXA Unspecified complication of genitourinary prosthetic device, implant and graft, initial encounter: Secondary | ICD-10-CM

## 2021-02-05 DIAGNOSIS — N400 Enlarged prostate without lower urinary tract symptoms: Secondary | ICD-10-CM | POA: Diagnosis present

## 2021-02-05 DIAGNOSIS — R338 Other retention of urine: Secondary | ICD-10-CM | POA: Diagnosis not present

## 2021-02-05 DIAGNOSIS — R339 Retention of urine, unspecified: Secondary | ICD-10-CM | POA: Diagnosis present

## 2021-02-05 DIAGNOSIS — Z6822 Body mass index (BMI) 22.0-22.9, adult: Secondary | ICD-10-CM

## 2021-02-05 DIAGNOSIS — Z7401 Bed confinement status: Secondary | ICD-10-CM

## 2021-02-05 DIAGNOSIS — N136 Pyonephrosis: Secondary | ICD-10-CM | POA: Diagnosis present

## 2021-02-05 DIAGNOSIS — A4102 Sepsis due to Methicillin resistant Staphylococcus aureus: Secondary | ICD-10-CM | POA: Diagnosis present

## 2021-02-05 DIAGNOSIS — Z66 Do not resuscitate: Secondary | ICD-10-CM | POA: Diagnosis present

## 2021-02-05 DIAGNOSIS — R6520 Severe sepsis without septic shock: Secondary | ICD-10-CM

## 2021-02-05 DIAGNOSIS — Z79899 Other long term (current) drug therapy: Secondary | ICD-10-CM

## 2021-02-05 DIAGNOSIS — E785 Hyperlipidemia, unspecified: Secondary | ICD-10-CM | POA: Diagnosis present

## 2021-02-05 DIAGNOSIS — E43 Unspecified severe protein-calorie malnutrition: Secondary | ICD-10-CM | POA: Diagnosis present

## 2021-02-05 DIAGNOSIS — F0390 Unspecified dementia without behavioral disturbance: Secondary | ICD-10-CM | POA: Diagnosis present

## 2021-02-05 DIAGNOSIS — F419 Anxiety disorder, unspecified: Secondary | ICD-10-CM | POA: Diagnosis present

## 2021-02-05 LAB — BASIC METABOLIC PANEL
Anion gap: 13 (ref 5–15)
BUN: 54 mg/dL — ABNORMAL HIGH (ref 8–23)
CO2: 16 mmol/L — ABNORMAL LOW (ref 22–32)
Calcium: 8.6 mg/dL — ABNORMAL LOW (ref 8.9–10.3)
Chloride: 103 mmol/L (ref 98–111)
Creatinine, Ser: 2.15 mg/dL — ABNORMAL HIGH (ref 0.61–1.24)
GFR, Estimated: 29 mL/min — ABNORMAL LOW (ref >60)
Glucose, Bld: 119 mg/dL — ABNORMAL HIGH (ref 70–99)
Potassium: 4.4 mmol/L (ref 3.5–5.1)
Sodium: 132 mmol/L — ABNORMAL LOW (ref 135–145)

## 2021-02-05 LAB — CBC WITH DIFFERENTIAL/PLATELET
Abs Immature Granulocytes: 0.15 10*3/uL — ABNORMAL HIGH (ref 0.00–0.07)
Basophils Absolute: 0.1 10*3/uL (ref 0.0–0.1)
Basophils Relative: 0 %
Eosinophils Absolute: 0 10*3/uL (ref 0.0–0.5)
Eosinophils Relative: 0 %
HCT: 34.6 % — ABNORMAL LOW (ref 39.0–52.0)
Hemoglobin: 11.8 g/dL — ABNORMAL LOW (ref 13.0–17.0)
Immature Granulocytes: 1 %
Lymphocytes Relative: 1 %
Lymphs Abs: 0.2 10*3/uL — ABNORMAL LOW (ref 0.7–4.0)
MCH: 33.3 pg (ref 26.0–34.0)
MCHC: 34.1 g/dL (ref 30.0–36.0)
MCV: 97.7 fL (ref 80.0–100.0)
Monocytes Absolute: 0.9 10*3/uL (ref 0.1–1.0)
Monocytes Relative: 5 %
Neutro Abs: 17.9 10*3/uL — ABNORMAL HIGH (ref 1.7–7.7)
Neutrophils Relative %: 93 %
Platelets: 183 10*3/uL (ref 150–400)
RBC: 3.54 MIL/uL — ABNORMAL LOW (ref 4.22–5.81)
RDW: 12.8 % (ref 11.5–15.5)
WBC: 19.2 10*3/uL — ABNORMAL HIGH (ref 4.0–10.5)
nRBC: 0 % (ref 0.0–0.2)

## 2021-02-05 LAB — BLOOD GAS, VENOUS
Acid-base deficit: 6.3 mmol/L — ABNORMAL HIGH (ref 0.0–2.0)
Bicarbonate: 19.7 mmol/L — ABNORMAL LOW (ref 20.0–28.0)
O2 Saturation: 72.7 %
Patient temperature: 37
pCO2, Ven: 40 mmHg — ABNORMAL LOW (ref 44.0–60.0)
pH, Ven: 7.3 (ref 7.250–7.430)
pO2, Ven: 43 mmHg (ref 32.0–45.0)

## 2021-02-05 LAB — TSH: TSH: 2.096 u[IU]/mL (ref 0.350–4.500)

## 2021-02-05 LAB — CK: Total CK: 277 U/L (ref 49–397)

## 2021-02-05 LAB — PROCALCITONIN: Procalcitonin: 1.07 ng/mL

## 2021-02-05 LAB — LACTIC ACID, PLASMA: Lactic Acid, Venous: 5.2 mmol/L (ref 0.5–1.9)

## 2021-02-05 MED ORDER — AMLODIPINE BESYLATE 10 MG PO TABS: 10.0000 mg | ORAL_TABLET | Freq: Every day | ORAL | Status: DC

## 2021-02-05 MED ORDER — SODIUM CHLORIDE 0.9 % IV SOLN: 2.0000 g | Freq: Once | INTRAVENOUS | Status: DC

## 2021-02-05 MED ORDER — LORAZEPAM 2 MG/ML IJ SOLN
INTRAMUSCULAR | Status: AC
  Filled 2021-02-05: qty 1

## 2021-02-05 MED ORDER — ONDANSETRON HCL 4 MG/2ML IJ SOLN: 4.0000 mg | Freq: Four times a day (QID) | INTRAMUSCULAR | Status: DC | PRN

## 2021-02-05 MED ORDER — METRONIDAZOLE IN NACL 5-0.79 MG/ML-% IV SOLN
500.0000 mg | Freq: Three times a day (TID) | INTRAVENOUS | Status: DC
  Administered 2021-02-05: 500 mg via INTRAVENOUS
  Filled 2021-02-05: qty 100

## 2021-02-05 MED ORDER — TRAZODONE HCL 50 MG PO TABS: 50.0000 mg | ORAL_TABLET | Freq: Every day | ORAL | Status: DC

## 2021-02-05 MED ORDER — LACTATED RINGERS IV BOLUS (SEPSIS)
1000.0000 mL | Freq: Once | INTRAVENOUS | Status: AC
  Administered 2021-02-05: 1000 mL via INTRAVENOUS

## 2021-02-05 MED ORDER — LISINOPRIL 10 MG PO TABS: 5.0000 mg | ORAL_TABLET | Freq: Every day | ORAL | Status: DC

## 2021-02-05 MED ORDER — SODIUM CHLORIDE 0.9 % IV SOLN
1.0000 g | Freq: Two times a day (BID) | INTRAVENOUS | Status: DC
  Administered 2021-02-05: 1 g via INTRAVENOUS
  Filled 2021-02-05: qty 1

## 2021-02-05 MED ORDER — ATORVASTATIN CALCIUM 20 MG PO TABS: 40.0000 mg | ORAL_TABLET | Freq: Every evening | ORAL | Status: DC

## 2021-02-05 MED ORDER — HALOPERIDOL LACTATE 5 MG/ML IJ SOLN
2.0000 mg | Freq: Once | INTRAMUSCULAR | Status: AC
  Administered 2021-02-05: 2 mg via INTRAVENOUS
  Filled 2021-02-05: qty 1

## 2021-02-05 MED ORDER — LEVETIRACETAM IN NACL 500 MG/100ML IV SOLN
500.0000 mg | Freq: Two times a day (BID) | INTRAVENOUS | Status: DC
  Administered 2021-02-05 – 2021-02-07 (×3): 500 mg via INTRAVENOUS
  Filled 2021-02-05 (×7): qty 100

## 2021-02-05 MED ORDER — LACTATED RINGERS IV BOLUS (SEPSIS)
250.0000 mL | Freq: Once | INTRAVENOUS | Status: AC
  Administered 2021-02-05: 250 mL via INTRAVENOUS

## 2021-02-05 MED ORDER — VANCOMYCIN HCL IN DEXTROSE 1-5 GM/200ML-% IV SOLN: 1000.0000 mg | Freq: Once | INTRAVENOUS | Status: DC

## 2021-02-05 MED ORDER — HYDRALAZINE HCL 25 MG PO TABS: 25.0000 mg | ORAL_TABLET | Freq: Three times a day (TID) | ORAL | Status: DC

## 2021-02-05 MED ORDER — SODIUM CHLORIDE 0.9 % IV SOLN: 2.0000 g | INTRAVENOUS | Status: DC

## 2021-02-05 MED ORDER — LACTATED RINGERS IV SOLN: INTRAVENOUS | Status: DC

## 2021-02-05 MED ORDER — ACETAMINOPHEN 325 MG PO TABS: 325.0000 mg | ORAL_TABLET | Freq: Four times a day (QID) | ORAL | Status: DC | PRN

## 2021-02-05 MED ORDER — ONDANSETRON HCL 4 MG PO TABS: 4.0000 mg | ORAL_TABLET | Freq: Four times a day (QID) | ORAL | Status: DC | PRN

## 2021-02-05 MED ORDER — TAMSULOSIN HCL 0.4 MG PO CAPS: 0.4000 mg | ORAL_CAPSULE | Freq: Every day | ORAL | Status: DC

## 2021-02-05 MED ORDER — VANCOMYCIN VARIABLE DOSE PER UNSTABLE RENAL FUNCTION (PHARMACIST DOSING): Status: DC

## 2021-02-05 MED ORDER — FINASTERIDE 5 MG PO TABS
5.0000 mg | ORAL_TABLET | Freq: Every day | ORAL | Status: DC
  Filled 2021-02-05: qty 1

## 2021-02-05 MED ORDER — SERTRALINE HCL 50 MG PO TABS: 75.0000 mg | ORAL_TABLET | Freq: Every day | ORAL | Status: DC

## 2021-02-05 MED ORDER — VANCOMYCIN HCL 1500 MG/300ML IV SOLN
1500.0000 mg | Freq: Once | INTRAVENOUS | Status: AC
  Administered 2021-02-05: 1500 mg via INTRAVENOUS
  Filled 2021-02-05: qty 300

## 2021-02-05 MED ORDER — LORAZEPAM 2 MG/ML IJ SOLN
2.0000 mg | INTRAMUSCULAR | Status: AC | PRN
  Administered 2021-02-05: 2 mg via INTRAVENOUS
  Filled 2021-02-05: qty 1

## 2021-02-05 MED ORDER — ACETAMINOPHEN 650 MG RE SUPP: 325.0000 mg | Freq: Four times a day (QID) | RECTAL | Status: DC | PRN

## 2021-02-05 NOTE — ED Notes (Signed)
Foley irrigated at this time. This RN noted no resistance in flushing or pulling back at this time.

## 2021-02-05 NOTE — Procedures (Signed)
Procedure Note  Preoperative diagnosis:  1.  Gross hematuria 2.  Acute urinary retention 3.  BPH 4.  Traumatic Foley catheter removal  Postoperative diagnosis: 1.  Gross hematuria 2.  Acute urinary retention 3.  BPH 4.  Traumatic Foley catheter removal  Procedure(s): 1.  Bedside flexible cystoscopy 2.  Difficult Foley catheter placement  Surgeon: Jettie Pagan, MD  Assistants:  None  Anesthesia: Uroject  Complications:  None  EBL:  11ml  Specimens: 1. None  Drains/Catheters: 1.  22 [place] council Foley catheter  Intraoperative findings:   1. Cystourethroscopy demonstrated clot burden throughout the entire pendulous urethra.  There is a large defect in the bulbar urethra.  There is clots present in the prostatic urethra.  Cystoscopy was limited given presence of hematuria.  Unable to completely visualize bladder. 2. Successful 22 [place] council tip Foley catheter placement.  Indication:  Andrew Bird is a 87 y.o. male with a history of BPH with recent urinary retention with Foley placed on 01/28/2021.  He presented today with almost complete Foley catheter removal with balloon inflated.  This led to gross hematuria.  Patient is demented and unable to achieve complete history.  Procedure was done emergently given ongoing bleeding.   Description of procedure: The patient was lying on the stretcher in mittens.  2 separate nurses facilitated and restraining him.  His existing Foley catheter was removed after deflating the balloon.  Of note this is almost out of his penile urethra prior to deflating.  His genitalia were prepped and draped in a standard sterile manner.  A Uroject was then placed into his urethra.  I attempted to place a 24 [place] three-way Foley catheter however this would not go past the fossa navicularis.  I then attempted to place a 22 [place] three-way Foley catheter however this would not go beyond the bulbar urethra.  I had resistance in this location.   Therefore, I proceed with bedside flexible cystoscopy where I identified a large clot burden throughout the entire urethra.  There is a large defect in the bulbar urethra.  I navigated dorsally beyond this into the prostatic urethra 1 more clots were present.  I then introduced my scope into the bladder which appeared to be free from clots however there was some dark pink-tinged urine limiting this diagnostic utility.  I then placed a 0.038 sensor wire into the bladder through the scope.  I then removed the cystoscope.  Over the previously placed wire, I then placed a 22 [place] council Foley catheter with immediate return of approximately 300 dark pink urine.  I then irrigated his bladder with light pink urine with no clots.  Irrigation was easily accomplished without resistance.  The Foley was then secured to a StatLock.  Patient tolerated this procedure well.  Matt R. Malie Kashani MD [facility]  Pager: [phone]

## 2021-02-05 NOTE — ED Notes (Signed)
Lab at bedside. Per lab, pt being combative and will not allow lab to draw at this time. Will make MD Quale aware at this time.

## 2021-02-05 NOTE — ED Notes (Signed)
[employer] called at this time to bring cystoscopy cart to ED at this time

## 2021-02-05 NOTE — ED Notes (Signed)
PA made aware of pt increasing agitation and combativeness towards this RN and other staff. Awaiting new orders at this time.

## 2021-02-05 NOTE — ED Notes (Signed)
Date and time results received: 02/05/21 1658 (use smartphrase ".now" to insert current time)  Test: Lactic Critical Value: 5.2  Name of Provider Notified: Menshew PA  Orders Received? Or Actions Taken?: Orders Received - See Orders for details

## 2021-02-05 NOTE — ED Notes (Addendum)
Pulled 1 vial ativan per Dr Sedalia Muta verbal order. Upon this RN's arrival to bedside, Dr. Sedalia Muta d/c ativan verbal order. Returned vial to pyxis with Danella Maiers as witness at this time.

## 2021-02-05 NOTE — ED Provider Notes (Signed)
[facility] Emergency Department Provider Note  ____________________________________________   Event Date/Time   First MD Initiated Contact with Patient 02/05/21 1236     (approximate)  I have reviewed the triage vital signs and the nursing notes.   HISTORY  Chief Complaint Bleeding from Catheter  History limited to EMS report of bleeding into urinary catheter.  HPI Andrew Bird is a 87 y.o. male who presents to the ED with history of acute metabolic encephalopathy, dementia, AKI, BPH, chronic urinary retention, and hypertension.  He presents to the ED at baseline which is consistent dysarthria and tremors.  Patient was reported to have attempted to dislodge his Foley catheter, and now presents with gross blood in the bag.  No other complaints or reports given by EMS.   Patient was discharged 3 days prior, and was supposed to be done up in urology in 7 days for voiding trial.    Past Medical History:  Diagnosis Date  . BPH (benign prostatic hyperplasia)   . Bradycardia   . Coronary artery disease   . Enlarged prostate   . Hypertension   . Renal disorder   . Senile dementia [facility](HCC)     Patient Active Problem List   Diagnosis Date Noted  . Sepsis (HCC) 02/05/2021  . Protein-calorie malnutrition, severe 02/01/2021  . AKI (acute kidney injury) (HCC) 01/28/2021  . Urinary retention 01/28/2021  . BPH (benign prostatic hyperplasia)   . Hypertension   . Acute metabolic encephalopathy 01/27/2021    Past Surgical History:  Procedure Laterality Date  . BURR HOLE FOR SUBDURAL HEMATOMA  11/22/2015  . CATARACT EXTRACTION, BILATERAL    . coronary artery stent    . HERNIA REPAIR  2008    Prior to Admission medications   Medication Sig Start Date End Date Taking? Authorizing Provider  amLODipine (NORVASC) 10 MG tablet Take 1 tablet (10 mg total) by mouth daily. 02/03/21   Tresa MooreSreenath, Sudheer B, MD  atorvastatin (LIPITOR) 40 MG tablet Take 40 mg by mouth  daily.    [provider]  cholecalciferol (VITAMIN D) 400 UNITS TABS tablet Take 2,000 Units by mouth daily.    [provider]  clobetasol cream (TEMOVATE) 0.05 % Apply 1 application topically 2 (two) times daily.    [provider]  famotidine (PEPCID) 20 MG tablet Take 20 mg by mouth 2 (two) times daily. 01/07/21   [provider]  finasteride (PROSCAR) 5 MG tablet Take 5 mg by mouth daily.    [provider]  fluticasone Aleda Grana(FLONASE) 50 MCG/ACT nasal spray Frequency:PRN   Dosage:50   MCG  Instructions:  Note:Dose: 50MCG 02/26/13   [provider]  hydrALAZINE (APRESOLINE) 25 MG tablet Take 1 tablet (25 mg total) by mouth every 8 (eight) hours. 02/02/21   Tresa MooreSreenath, Sudheer B, MD  Lactobacillus (ACIDOPHILUS) CAPS capsule Take by mouth. 03/06/11   [provider]  levETIRAcetam (KEPPRA) 500 MG tablet Take 500 mg by mouth 2 (two) times daily. 12/09/16   [provider]  lisinopril (PRINIVIL,ZESTRIL) 10 MG tablet Take 5 mg by mouth daily.    [provider]  mometasone (ELOCON) 0.1 % ointment  03/04/20   [provider]  ranitidine (ZANTAC) 150 MG tablet Take 150 mg by mouth 2 (two) times daily.    [provider]  sertraline (ZOLOFT) 50 MG tablet Take 75 mg by mouth daily. 07/21/20 07/21/21  [provider]  tamsulosin (FLOMAX) 0.4 MG CAPS capsule Take 0.4 mg by  mouth daily.    [provider]  timolol (TIMOPTIC) 0.5 % ophthalmic solution Place 1 drop into both eyes daily.    [provider]    Allergies Aspirin  History reviewed. No pertinent family history.  Social History Social History   Tobacco Use  . Smoking status: Former Games developer  . Smokeless tobacco: Never Used  Substance Use Topics  . Alcohol use: No    Review of Systems Constitutional: No fever/chills Eyes: No visual changes. ENT: No sore throat. Cardiovascular: Denies chest pain. Respiratory: Denies  shortness of breath. Gastrointestinal: No abdominal pain.  No nausea, no vomiting.  No diarrhea.  No constipation. Genitourinary: Negative for dysuria. Gross hematuria. Musculoskeletal: Negative for back pain. Skin: Negative for rash. Neurological: Negative for headaches, focal weakness or numbness. ____________________________________________   PHYSICAL EXAM:  VITAL SIGNS: ED Triage Vitals [02/05/21 1250]  Enc Vitals Group     BP (!) 111/57     Pulse Rate (!) 118     Resp 20     Temp      Temp src      SpO2 94 %     Weight 165 lb 2 oz (74.9 kg)     Height 6' (1.829 m)     Head Circumference      Peak Flow      Pain Score      Pain Loc      Pain Edu?      Excl. in GC?    Constitutional: Alert and oriented. Well appearing and in no acute distress. Eyes: Conjunctivae are normal. PERRL. EOMI. Head: Atraumatic. Nose: No congestion/rhinnorhea. Mouth/Throat: Mucous membranes are moist.  Oropharynx non-erythematous. Neck: No stridor.   Cardiovascular: Normal rate, regular rhythm. Grossly normal heart sounds.  Good peripheral circulation. Respiratory: Normal respiratory effort.  No retractions. Lungs CTAB. Gastrointestinal: Soft and nontender. No distention. No abdominal bruits. No CVA tenderness. Genitourinary: Foley catheter in place. Gross blood noted in tube and canister.  Musculoskeletal: No lower extremity tenderness nor edema.  No joint effusions. Neurologic:  Patient moves all extremities actively. Gross tremors noted to UE. Skin:  Skin is warm, dry and intact. No rash noted. Psychiatric: Mood and affect are normal. Speech and behavior are normal.  ____________________________________________   LABS (all labs ordered are listed, but only abnormal results are displayed)  Labs Reviewed  BASIC METABOLIC PANEL - Abnormal; Notable for the following components:      Result Value   Sodium 132 (*)    CO2 16 (*)    Glucose, Bld 119 (*)    BUN 54 (*)    Creatinine, Ser  2.15 (*)    Calcium 8.6 (*)    GFR, Estimated 29 (*)    All other components within normal limits  CBC WITH DIFFERENTIAL/PLATELET - Abnormal; Notable for the following components:   WBC 19.2 (*)    RBC 3.54 (*)    Hemoglobin 11.8 (*)    HCT 34.6 (*)    Neutro Abs 17.9 (*)    Lymphs Abs 0.2 (*)    Abs Immature Granulocytes 0.15 (*)    All other components within normal limits  BLOOD GAS, VENOUS - Abnormal; Notable for the following components:   pCO2, Ven 40 (*)    Bicarbonate 19.7 (*)    Acid-base deficit 6.3 (*)    All other components within normal limits  LACTIC ACID, PLASMA - Abnormal; Notable for the following components:   Lactic Acid, Venous 5.2 (*)  All other components within normal limits  URINE CULTURE  CULTURE, BLOOD (ROUTINE X 2)  CULTURE, BLOOD (ROUTINE X 2)  SARS CORONAVIRUS 2 (TAT 6-24 HRS)  CK  CBC WITH DIFFERENTIAL/PLATELET  PROCALCITONIN  PROCALCITONIN  TSH   ____________________________________________  EKG   Continuous cardiac monitoring - no arrhythmias noted ____________________________________________  RADIOLOGY I, Lissa Hoard, personally viewed and evaluated these images (plain radiographs) as part of my medical decision making, as well as reviewing the written report by the radiologist.  ED MD interpretation:  Agree with radiologist  Official radiology report(s): CT Renal Stone Study  Result Date: 02/05/2021 CLINICAL DATA:  Hematuria. EXAM: CT ABDOMEN AND PELVIS WITHOUT CONTRAST TECHNIQUE: Multidetector CT imaging of the abdomen and pelvis was performed following the standard protocol without IV contrast. COMPARISON:  None. FINDINGS: Lower chest: Trace bilateral pleural effusions. Mild increased subpleural reticulation at the lung bases. Minimal dependent subsegmental atelectasis in both lower lobes. Hepatobiliary: No focal liver abnormality is seen. No gallstones, gallbladder wall thickening, or biliary dilatation. Pancreas:  Unremarkable. No pancreatic ductal dilatation or surrounding inflammatory changes. Spleen: Normal in size without focal abnormality. Adrenals/Urinary Tract: The adrenal glands are unremarkable. Multiple bilateral renal simple cysts measuring up to 8.9 cm on the right and 9.3 cm on the left. 4.9 cm right and 4.0 cm left minimally complicated cysts with thin peripheral calcification (series 2, image 30). 1.4 cm high density lesion in the left kidney, likely a hemorrhagic or proteinaceous cyst. Moderate right hydroureteronephrosis with several small foci of air within the collecting system. No left hydronephrosis. No renal calculi. Small amount of layering hyperdensity in the posterior bladder. Layering air within the anterior bladder related to recent instrumentation. There is also scattered air within the bladder wall, which is not significantly thickened. Stomach/Bowel: Stomach is within normal limits. Appendix appears normal. No evidence of bowel wall thickening, distention, or inflammatory changes. Severe sigmoid colonic diverticulosis. Vascular/Lymphatic: Aortic atherosclerosis. Infrarenal abdominal aortic aneurysm measuring up to 6.2 cm. 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 No enlarged abdominal or pelvic lymph nodes. Reproductive: Mild prostatomegaly with median lobe hypertrophy indenting the bladder base. Other: Prior right inguinal hernia repair. No free fluid her pneumoperitoneum. Mild presacral stranding. Musculoskeletal: No acute or significant osseous findings. Chronic L2 superior endplate compression fracture. IMPRESSION: 1. Small amount of layering hemorrhage in the posterior bladder. Scattered air within the bladder wall, suggestive of emphysematous cystitis. 2. Moderate right hydroureteronephrosis without evident obstructing lesion or calculi. Small foci of air within the right renal collecting system, favor reflux from emphysematous cystitis versus  less likely emphysematous pyelitis given degree of air. 3. Infrarenal abdominal aortic aneurysm measuring up to 6.2 cm. Recommend referral to a vascular specialist. This recommendation follows ACR consensus guidelines: White Paper of the ACR Incidental Findings Committee II on Vascular Findings. J Am Coll Radiol 2013; 73:532-992. 4. Aortic Atherosclerosis (ICD10-I70.0). Electronically Signed   By: Obie Dredge M.D.   On: 02/05/2021 15:56    ____________________________________________   PROCEDURES  Procedure(s) performed (including Critical Care):  Procedures  NS 1000 ml bolus Vancomycin 1500 mg IVPB Meropenem 1 g IVPB Haloperidol 2 mg IVP  ____________________________________________   INITIAL IMPRESSION / ASSESSMENT AND PLAN / ED COURSE  As part of my medical decision making, I reviewed the following data within the electronic MEDICAL RECORD NUMBERcontinuous cardiac monitoring, Old chart reviewed, Discussed with admitting physician Amy Cox, Notes from prior ED visits and [place] Controlled Substance Database  Geriatric patient presents to the ED via EMS with complaints of agitation applied to Foley catheter.  Patient who is agitated periodically, and dysarthric at baseline, is unable to provide any history.  Patient was found however, to be tachycardic, hypertensive, and with gross hematuria.  Labs revealed an elevated white count at 19+.  Initial lactic acid was critical at 5.2.  He was also found to have an acute on chronic BUN/Cr spike. His CT renal stone study which reveals emphysematous cystitis, mild right hydronephrosis, and an infrarenal AAA measuring up to 6.2 cm.   Patient will be admitted to the hospital service secondary to acute sepsis, supported by his elevated white blood cell count or lactic acid.  Patient is given empiric antibiotics with vancomycin and meropenem.  IV fluid boluses started, and Haldol is been provided secondary to patient agitation.  Home medications will be  reconciled and patient will be followed by vascular surgeon on outpatient basis.  ----------------------------------------- 5:09 PM on 02/05/2021 ----------------------------------------- S/W Dr. Imogene Burn (Vascular): he suggests outpatient management of the newly discovered infrarenal AAA. He will not be treated acutely during this admission due to his sepsis diagnosis.   ----------------------------------------- 5:16 PM on 02/05/2021 ----------------------------------------- Dr. Londell Moh in to evaluate and admit the patient.    ____________________________________________   FINAL CLINICAL IMPRESSION(S) / ED DIAGNOSES  Final diagnoses:  Foley catheter problem, initial encounter (HCC)  Sepsis, due to unspecified organism, unspecified whether acute organ dysfunction present [facility])     ED Discharge Orders    None      *Please note:  Andrew Bird was evaluated in Emergency Department on 02/05/2021 for the symptoms described in the history of present illness. He was evaluated in the context of the global COVID-19 pandemic, which necessitated consideration that the patient might be at risk for infection with the SARS-CoV-2 virus that causes COVID-19. Institutional protocols and algorithms that pertain to the evaluation of patients at risk for COVID-19 are in a state of rapid change based on information released by regulatory bodies including the CDC and federal and state organizations. These policies and algorithms were followed during the patient's care in the ED.  Some ED evaluations and interventions may be delayed as a result of limited staffing during and the pandemic.*   Note:  This document was prepared using Dragon voice recognition software and may include unintentional dictation errors.    Lissa Hoard, PA-C 02/05/21 1731    Minna Antis, MD 02/05/21 2233

## 2021-02-05 NOTE — H&P (Addendum)
History and Physical   Andrew Bird ALP:[id number] DOB: [birth year] DOA: 02/05/2021  PCP: Andrew Housekeeper, MD  Outpatient Specialists: Dr. Alberteen Bird, Podiatry Patient coming from: [facility]  I have personally briefly reviewed patient's old medical records in [facility] EMR.  Chief Concern: blood in foley bag   HPI: Andrew Bird is a 87 y.o. male with medical history significant for hypertension, hyperlipidemia, BPH, subdural hematoma, requiring bur hole in November 2016, dementia, presented to the emergency department for chief concerns of bleeding from urinary catheter.  It was reported per ED provider that patient was attempting to dislodge his Foley catheter and now presents with gross blood in the bag.  No other complaints or reports given by EMS.  Attempting to reach spouse over the phone without success.  ROS: Unable to obtain as patient is septic and has advanced dementia  ED Course: Discussed with ED provider, patient requiring hospitalization due to hematuria and sepsis.  Assessment/Plan  Principal Problem:   Sepsis (HCC) Active Problems:   Acute metabolic encephalopathy   AKI (acute kidney injury) (HCC)   Urinary retention   BPH (benign prostatic hyperplasia)   Hypertension   Protein-calorie malnutrition, severe   Severe sepsis-with elevated heart rate, mild hypotension, leukocytosis of 19.2, and lactic acid of 5.2 -EDP started patient on broad-spectrum antibiotics with meropenem and Vanco once -Sepsis bolus ordered -LR at 125 cc/h for 1 day ordered -Sepsis broad-spectrum antibiotic initiated: Vanco, cefepime, metronidazole -Checking pro-Cal, portable chest x-ray, TSH,  Agitation and anxiety-in setting of advanced dementia -Suspect secondary to severe sepsis -Treat as above  Profound overt hematuria -Dr. Jettie Bird has been consulted and states he will see the patient -Appreciate further recommendations  Acute kidney  injury-etiology unclear however patient's mentation is poor and mucosa appears dry, I suspect this is prerenal -Differentials include prerenal, sepsis, anemia -Sepsis fluid bolus has been ordered -LR IVF has also been ordered -BMP in the a.m.  6.2 cm infrarenal abdominal aortic aneurysm - vascular surgery was contacted by EDP. Dr. Imogene Bird recommends outpatient evaluation and repair once patient is stable  Medication history shows Andrew Bird 500 mg twice daily-query seizure history -Andrew Bird 500 mg twice daily p.o. has been converted to Andrew Bird 500 mg IV twice daily -Seizure precautions  Severe protein malnutrition-dietary has been consulted  Aspiration, seizures, fall precautions One-to-one sitter as patient is agitated and attempted to pull out Foley and required Haldol in the ED Chart reviewed.   [facility] hospitalization from 01/27/2021-02/02/2021 for acute metabolic encephalopathy presenting from home.  Noncontrast CT was done at the time and showed no evidence of acute intracranial process, including no evidence of intracranial hemorrhage, well read as diffuse cortical atrophy.  Postvoid residual scan showed greater than 450 cc of urine.  Urology was consulted for urinary retention and Foley was placed with plans to discharge with Foley in place and follow-up with urology 7 days post discharge for voiding trial.   Patient was noted to be agitated and attempting to pull out IVs in the ED and he was given Haldol 2 mg IV once initially.  He also received 1 L lactated Ringer bolus.  It was noted that baseline mental status is unclear however patient was not interactive and noted to be somnolent.  Initial recommendation was for skilled nursing facility.  Family initially refused wanting to take patient home however reconsidered and and agreeable to SNF placement.  On day of discharge, patient was hemodynamically stable, mental status remains poor, patient's wife  was agreeable for skilled nursing facility  placement.  DVT prophylaxis: Holding due to active bleeding Code Status: DNR from prior Diet: N.p.o. due to aspiration concerns Family Communication: Attempted to call spouse Andrew Bird and left voicemail as no one picked up Disposition Plan: Pending clinical course Consults called: Urology Admission status: Observation to progressive cardiac  Past Medical History:  Diagnosis Date  . BPH (benign prostatic hyperplasia)   . Bradycardia   . Coronary artery disease   . Enlarged prostate   . Hypertension   . Renal disorder   . Senile dementia Andrew Bird)    Past Surgical History:  Procedure Laterality Date  . BURR HOLE FOR SUBDURAL HEMATOMA  11/22/2015  . CATARACT EXTRACTION, BILATERAL    . coronary artery stent    . HERNIA REPAIR  2008   Social History:  reports that he has quit smoking. He has never used smokeless tobacco. He reports that he does not drink alcohol. No history on file for drug use.  Allergies  Allergen Reactions  . Aspirin Other (See Comments)    Other reaction(s): Blood Disorder Intracranial bleed Intracranial bleed    History reviewed. No pertinent family history. Family history: Family history reviewed and not pertinent  Prior to Admission medications   Medication Sig Start Date End Date Taking? Authorizing Provider  amLODipine (NORVASC) 10 MG tablet Take 1 tablet (10 mg total) by mouth daily. 02/03/21   Tresa Moore, MD  atorvastatin (LIPITOR) 40 MG tablet Take 40 mg by mouth daily.    [provider]  cholecalciferol (VITAMIN D) 400 UNITS TABS tablet Take 2,000 Units by mouth daily.    [provider]  clobetasol cream (TEMOVATE) 0.05 % Apply 1 application topically 2 (two) times daily.    [provider]  famotidine (PEPCID) 20 MG tablet Take 20 mg by mouth 2 (two) times daily. 01/07/21   [provider]  finasteride (PROSCAR) 5 MG tablet Take 5 mg by mouth daily.    [provider]  fluticasone  Aleda Grana) 50 MCG/ACT nasal spray Frequency:PRN   Dosage:50   MCG  Instructions:  Note:Dose: 02/26/13   [provider]  hydrALAZINE (APRESOLINE) 25 MG tablet Take 1 tablet (25 mg total) by mouth every 8 (eight) hours. 02/02/21   Tresa Moore, MD  Lactobacillus (ACIDOPHILUS) CAPS capsule Take by mouth. 03/06/11   [provider]  levETIRAcetam (Andrew Bird) 500 MG tablet Take 500 mg by mouth 2 (two) times daily. 12/09/16   [provider]  lisinopril (PRINIVIL,ZESTRIL) 10 MG tablet Take 5 mg by mouth daily.    [provider]  mometasone (ELOCON) 0.1 % ointment  03/04/20   [provider]  ranitidine (ZANTAC) 150 MG tablet Take 150 mg by mouth 2 (two) times daily.    [provider]  sertraline (ZOLOFT) 50 MG tablet Take 75 mg by mouth daily. 07/21/20 07/21/21  [provider]  tamsulosin (FLOMAX) 0.4 MG CAPS capsule Take 0.4 mg by mouth daily.    [provider]  timolol (TIMOPTIC) 0.5 % ophthalmic solution Place 1 drop into both eyes daily.    [provider]   Physical Exam: Vitals:   02/05/21 1250 02/05/21 1300 02/05/21 1330 02/05/21 1600  BP: (!) 111/57 126/77 102/84 125/66  Pulse: (!) 118 (!) 112 100 98  Resp: 20 20 20 20   Temp:    99 F (37.2 C)  TempSrc:    Oral  SpO2: 94% 94% 95% 95%  Weight: 74.9  kg     Height: 6' (1.829 m)      Constitutional: appears age appropriate, agitated, altered Eyes: PERRL, lids and conjunctivae normal ENMT: Mucous membranes are moist. Posterior pharynx clear of any exudate or lesions. Age-appropriate dentition.  Unable to assess Neck: normal, supple, no masses, no thyromegaly Respiratory: clear to auscultation bilaterally, no wheezing, no crackles. Normal respiratory effort. No accessory muscle use.  Cardiovascular: Regular rate and rhythm, no murmurs / rubs / gallops. No extremity edema. 2+ pedal pulses. No carotid bruits.  Abdomen: no tenderness, no masses palpated, no  hepatosplenomegaly. Bowel sounds positive.  Musculoskeletal: no clubbing / cyanosis. No joint deformity upper and lower extremities. Good ROM, no contractures, no atrophy. Normal muscle tone.  Skin: no rashes, lesions, ulcers. No induration Neurologic: Sensation intact. Strength 5/5 in all 4.  Psychiatric: Patient has advanced dementia appears altered and is agitated  EKG: independently reviewed, showing sinus rhythm with a rate of 62, QTc 438, R wave had high amplitude suggestive of LVH   Chest x-ray on Admission: I personally reviewed and I agree with radiologist reading as below.  CT Renal Stone Study  Result Date: 02/05/2021 CLINICAL DATA:  Hematuria. EXAM: CT ABDOMEN AND PELVIS WITHOUT CONTRAST TECHNIQUE: Multidetector CT imaging of the abdomen and pelvis was performed following the standard protocol without IV contrast. COMPARISON:  None. FINDINGS: Lower chest: Trace bilateral pleural effusions. Mild increased subpleural reticulation at the lung bases. Minimal dependent subsegmental atelectasis in both lower lobes. Hepatobiliary: No focal liver abnormality is seen. No gallstones, gallbladder wall thickening, or biliary dilatation. Pancreas: Unremarkable. No pancreatic ductal dilatation or surrounding inflammatory changes. Spleen: Normal in size without focal abnormality. Adrenals/Urinary Tract: The adrenal glands are unremarkable. Multiple bilateral renal simple cysts measuring up to 8.9 cm on the right and 9.3 cm on the left. 4.9 cm right and 4.0 cm left minimally complicated cysts with thin peripheral calcification (series 2, image 30). 1.4 cm high density lesion in the left kidney, likely a hemorrhagic or proteinaceous cyst. Moderate right hydroureteronephrosis with several small foci of air within the collecting system. No left hydronephrosis. No renal calculi. Small amount of layering hyperdensity in the posterior bladder. Layering air within the anterior bladder related to recent  instrumentation. There is also scattered air within the bladder wall, which is not significantly thickened. Stomach/Bowel: Stomach is within normal limits. Appendix appears normal. No evidence of bowel wall thickening, distention, or inflammatory changes. Severe sigmoid colonic diverticulosis. Vascular/Lymphatic: Aortic atherosclerosis. Infrarenal abdominal aortic aneurysm measuring up to 6.2 cm. 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 No enlarged abdominal or pelvic lymph nodes. Reproductive: Mild prostatomegaly with median lobe hypertrophy indenting the bladder base. Other: Prior right inguinal hernia repair. No free fluid her pneumoperitoneum. Mild presacral stranding. Musculoskeletal: No acute or significant osseous findings. Chronic L2 superior endplate compression fracture. IMPRESSION: 1. Small amount of layering hemorrhage in the posterior bladder. Scattered air within the bladder wall, suggestive of emphysematous cystitis. 2. Moderate right hydroureteronephrosis without evident obstructing lesion or calculi. Small foci of air within the right renal collecting system, favor reflux from emphysematous cystitis versus less likely emphysematous pyelitis given degree of air. 3. Infrarenal abdominal aortic aneurysm measuring up to 6.2 cm. Recommend referral to a vascular specialist. This recommendation follows ACR consensus guidelines: White Paper of the ACR Incidental Findings Committee II on Vascular Findings. J Am Coll Radiol 2013; 01:093-235. 4. Aortic Atherosclerosis (ICD10-I70.0). Electronically Signed   By: Vickki Hearing.D.  On: 02/05/2021 15:56   Labs on Admission: I have personally reviewed following labs  CBC: Recent Labs  Lab 02/01/21 0439 02/02/21 0505 02/05/21 1433  WBC 10.6* 9.8 19.2*  NEUTROABS  --  7.2 17.9*  HGB 13.0 12.9* 11.8*  HCT 37.1* 36.0* 34.6*  MCV 95.1 94.7 97.7  PLT 139* 140* 183   Basic Metabolic Panel: Recent Labs   Lab 01/30/21 0419 02/01/21 0810 02/02/21 0505 02/05/21 1252  NA  --  135 134* 132*  K  --  4.0 3.6 4.4  CL  --  106 102 103  CO2  --  21* 23 16*  GLUCOSE  --  130* 131* 119*  BUN 36* 33* 42* 54*  CREATININE 1.81* 1.87* 1.87* 2.15*  CALCIUM  --  8.6* 8.7* 8.6*   GFR: Estimated Creatinine Clearance: 24.2 mL/min (A) (by C-G formula based on SCr of 2.15 mg/dL (H)).  Coagulation Profile: No results for input(s): INR, PROTIME in the last 168 hours.  Cardiac Enzymes: Recent Labs  Lab 02/05/21 1434  CKTOTAL 277   Urine analysis:    Component Value Date/Time   COLORURINE YELLOW (A) 01/27/2021 1309   APPEARANCEUR CLEAR (A) 01/27/2021 1309   LABSPEC 1.018 01/27/2021 1309   PHURINE 6.0 01/27/2021 1309   GLUCOSEU NEGATIVE 01/27/2021 1309   HGBUR NEGATIVE 01/27/2021 1309   BILIRUBINUR NEGATIVE 01/27/2021 1309   KETONESUR NEGATIVE 01/27/2021 1309   PROTEINUR NEGATIVE 01/27/2021 1309   NITRITE NEGATIVE 01/27/2021 1309   LEUKOCYTESUR NEGATIVE 01/27/2021 1309   CRITICAL CARE Performed by: Nadyne CoombesAmy N Devann Cribb  Total critical care time: 35 minutes  Critical care time was exclusive of separately billable procedures and treating other patients.  Critical care was necessary to treat or prevent imminent or life-threatening deterioration. Severe sepsis  Critical care was time spent personally by me on the following activities: development of treatment plan with patient and/or surrogate as well as nursing, discussions with consultants, evaluation of patient's response to treatment, examination of patient, obtaining history from patient or surrogate, ordering and performing treatments and interventions, ordering and review of laboratory studies, ordering and review of radiographic studies, pulse oximetry and re-evaluation of patient's condition.  Abhijot Straughter N Amiel Mccaffrey D.O. [facility]  If 7PM-7AM, please contact overnight-coverage provider If 7AM-7PM, please contact day coverage  provider [email or web address]  02/05/2021, 6:06 PM

## 2021-02-05 NOTE — ED Provider Notes (Signed)
Medical screening examination/treatment/procedure(s) were conducted as a shared visit with non-physician practitioner(s) and myself.  I personally evaluated the patient during the encounter.    Patient continues to exhibit some psychomotor agitation, noted during his previous hospitalization.  Additionally today his white count is elevated, mild AKI.  Concern the patient may have sepsis with elevated white count mildly reduced blood pressures.  We will plan to admit to the hospital for further care management, and can also obtain urology consult regarding catheter issues.    Signout, plan to admit for further care and management, rule out sepsis.   Sharyn Creamer, MD 02/05/21 [phone]

## 2021-02-05 NOTE — ED Triage Notes (Signed)
Pt arrived to ED via [facility] from [facility] with c/o bleeding from urinary catheter.   Per EMS, pt has dementia at baseline.   Per EMS, family requested pt to be seen in ED due to blood in catheter because pt has been tugging on it. Per EMS, catheter is scheduled to be removed tomorrow.   EMS VS: 162/94, 95% RA, HR 103 at this time.   Pt has notable tremor at this time.   This RN noted large amount of bright red blood in catheter

## 2021-02-05 NOTE — ED Notes (Signed)
Art therapist and this RN to bedside at this time. Pt cleaned up after small BM. Clean brief and chux applied at this time. Pt tolerated well.  Pt given warm blanket at this time.

## 2021-02-05 NOTE — ED Notes (Signed)
Wife Adela Lank updated on pt status at this time

## 2021-02-05 NOTE — ED Notes (Signed)
Foley irrigated with DR Cardell Peach att, urine appears blood stain and with out clots, flow is unimpeded

## 2021-02-05 NOTE — ED Notes (Addendum)
Andrew Bird, wife, called, reports was DC'd from hospital on last Friday to [facility] fell twice there (first fall on Friday) injury to left shoulder from fall and injury right arm skin tear from Saturday morning  Hx dementia for the 6 years, catheter in d/t cyst on prostrate occluding urine flow  POC updated  "if he has a change in condition please call me"

## 2021-02-05 NOTE — ED Notes (Addendum)
Bladder scan completed at this time. Pt has 360 mL in bladder at this time. PA made aware at this time.

## 2021-02-05 NOTE — ED Notes (Signed)
Called lab to come draw at this time. Per lab, will send someone

## 2021-02-05 NOTE — ED Notes (Signed)
Per Dr. Sedalia Muta, do not remove foley that pt arrived with at this time. Awaiting urology consult at this time.

## 2021-02-05 NOTE — Consult Note (Signed)
Pharmacy Antibiotic Note  Andrew Bird is a 87 y.o. male admitted on 02/05/2021 with sepsis.  Pharmacy has been consulted for Vancomcyin & cefepime dosing.  Plan: Vancomycin 1.5g x1 [Calculated half-life ~28hr so will f/u AM renal fxn to determine maintenance regimen.] -- If Scr stable, Currently calculating Vancomycin 1g q36h to predicted AUC of 505; Cmax 31.7; Cmin 13.4 (using TBW<IBW; Vd0.72; Scr: 2.15)  Merrem --> Cefepime 2g q24h Metronidazole 500mg  IV q8h  Height: 6' (182.9 cm) Weight: 74.9 kg (165 lb 2 oz) IBW/kg (Calculated) : 77.6  Temp (24hrs), Avg:99 F (37.2 C), Min:99 F (37.2 C), Max:99 F (37.2 C)  Recent Labs  Lab 01/30/21 0419 02/01/21 0439 02/01/21 0810 02/02/21 0505 02/05/21 1252 02/05/21 1433 02/05/21 1557  WBC  --  10.6*  --  9.8  --  19.2*  --   CREATININE 1.81*  --  1.87* 1.87* 2.15*  --   --   LATICACIDVEN  --   --   --   --   --   --  5.2*    Estimated Creatinine Clearance: 24.2 mL/min (A) (by C-G formula based on SCr of 2.15 mg/dL (H)).    Allergies  Allergen Reactions  . Aspirin Other (See Comments)    Other reaction(s): Blood Disorder Intracranial bleed Intracranial bleed     Antimicrobials this admission: Vancomycin (2/13>> Merrem --> Cefepime 2g q24h (2/13>> Metronidazole 500mg  IV q8h (2/13>>  Microbiology results: 2/13 BCx: sent/pending 2/13 UCx: sent/pending  2/13 MRSA PCR: ordered  Thank you for allowing pharmacy to be a part of this patient's care.  3/13 Andrew Bird 02/05/2021 5:50 PM

## 2021-02-05 NOTE — ED Notes (Signed)
Fall pad on and in place at this time. Warm blanket given at this time

## 2021-02-05 NOTE — Consult Note (Signed)
Urology Consult    Physician requesting consult: Amy Cox, DO  Reason for consult: foley catheter trauma  History of Present Illness: Andrew Bird is a 87 y.o. with a history of advanced dementia who presented to the emergency department today with gross hematuria.  Patient has advanced dementia and is currently agitated.  He is unable to communicate effectively with me so history is obtained from review of the chart.  According the notes, he presented on 01/28/2021 and found to be in acute urinary retention.  He has a long history of BPH treated with tamsulosin and finasteride as an outpatient.  Bladder scan at that time demonstrated grade 140 to 50 mL of urine.  A Foley catheter was placed and he was discharged to his living facility.  He presented to the ED today with gross hematuria.  Upon exam, it is evident that his catheter is almost completely out of his penis with the balloon still inflated with active bleeding around the Foley catheter.  Foley catheter was removed.  I was unable to pass a 24 [place] three-way or a 22 [place] three-way into his bladder given the history of trauma.  Therefore, bedside flexible cystoscopy allowed for access into his bladder to place a 0.038 sensor wire in the bladder and subsequently placed a 22 French Councill catheter.  This return light pink tinge urine.  This was irrigated with no clots in his bladder.  He is less agitated after draining his bladder.  There is no family at bedside to discuss more history.  Past Medical History:  Diagnosis Date  . BPH (benign prostatic hyperplasia)   . Bradycardia   . Coronary artery disease   . Enlarged prostate   . Hypertension   . Renal disorder   . Senile dementia [facility])     Past Surgical History:  Procedure Laterality Date  . BURR HOLE FOR SUBDURAL HEMATOMA  11/22/2015  . CATARACT EXTRACTION, BILATERAL    . coronary artery stent    . HERNIA REPAIR  2008    Current Hospital Medications:  Home Meds:  No  current facility-administered medications on file prior to encounter.   Current Outpatient Medications on File Prior to Encounter  Medication Sig Dispense Refill  . amLODipine (NORVASC) 10 MG tablet Take 1 tablet (10 mg total) by mouth daily.    Marland Kitchen atorvastatin (LIPITOR) 40 MG tablet Take 40 mg by mouth daily.    . cholecalciferol (VITAMIN D) 400 UNITS TABS tablet Take 2,000 Units by mouth daily.    . clobetasol cream (TEMOVATE) 0.05 % Apply 1 application topically 2 (two) times daily.    . famotidine (PEPCID) 20 MG tablet Take 20 mg by mouth 2 (two) times daily.    . finasteride (PROSCAR) 5 MG tablet Take 5 mg by mouth daily.    . hydrALAZINE (APRESOLINE) 25 MG tablet Take 1 tablet (25 mg total) by mouth every 8 (eight) hours.    . levETIRAcetam (KEPPRA) 500 MG tablet Take 500 mg by mouth 2 (two) times daily.    Marland Kitchen lisinopril (ZESTRIL) 5 MG tablet Take 5 mg by mouth daily.    . ranitidine (ZANTAC) 150 MG tablet Take 150 mg by mouth 2 (two) times daily.    . sertraline (ZOLOFT) 50 MG tablet Take 75 mg by mouth daily.    . tamsulosin (FLOMAX) 0.4 MG CAPS capsule Take 0.4 mg by mouth daily.    . timolol (TIMOPTIC) 0.5 % ophthalmic solution Place 1 drop into both eyes daily.    Marland Kitchen  traZODone (DESYREL) 50 MG tablet Take 50 mg by mouth at bedtime.    . mometasone (ELOCON) 0.1 % ointment  (Patient not taking: No sig reported)       Scheduled Meds: . LORazepam      . vancomycin variable dose per unstable renal function (pharmacist dosing)   Does not apply See admin instructions   Continuous Infusions: . [START ON 02/06/2021] ceFEPime (MAXIPIME) IV    . lactated ringers 125 mL/hr at 02/05/21 1845  . levETIRAcetam    . metronidazole     PRN Meds:.acetaminophen **OR** acetaminophen, LORazepam, ondansetron **OR** ondansetron (ZOFRAN) IV  Allergies:  Allergies  Allergen Reactions  . Aspirin Other (See Comments)    Other reaction(s): Blood Disorder Intracranial bleed Intracranial bleed      History reviewed. No pertinent family history.  Social History:  reports that he has quit smoking. He has never used smokeless tobacco. He reports that he does not drink alcohol. No history on file for drug use.  ROS: A complete review of systems was performed.  All systems are negative except for pertinent findings as noted.  Physical Exam:  Vital signs in last 24 hours: Temp:  [99 F (37.2 C)] 99 F (37.2 C) (02/13 1600) Pulse Rate:  [98-118] 98 (02/13 1600) Resp:  [20] 20 (02/13 1600) BP: (102-126)/(57-84) 125/66 (02/13 1600) SpO2:  [94 %-95 %] 95 % (02/13 1600) Weight:  [74.9 kg] 74.9 kg (02/13 1250) Constitutional:  Alert and oriented, No acute distress Cardiovascular: Regular rate and rhythm Respiratory: Normal respiratory effort, Lungs clear bilaterally GI: Abdomen is soft, nontender, nondistended, no abdominal masses GU: No CVA tenderness; penis with bleeding per urethra, no lesions. Neurologic: Grossly intact, no focal deficits Psychiatric: Normal mood and affect  Laboratory Data:  Recent Labs    02/05/21 1433  WBC 19.2*  HGB 11.8*  HCT 34.6*  PLT 183    Recent Labs    02/05/21 1252  NA 132*  K 4.4  CL 103  GLUCOSE 119*  BUN 54*  CALCIUM 8.6*  CREATININE 2.15*     Results for orders placed or performed during the hospital encounter of 02/05/21 (from the past 24 hour(s))  Basic metabolic panel     Status: Abnormal   Collection Time: 02/05/21 12:52 PM  Result Value Ref Range   Sodium 132 (L) 135 - 145 mmol/L   Potassium 4.4 3.5 - 5.1 mmol/L   Chloride 103 98 - 111 mmol/L   CO2 16 (L) 22 - 32 mmol/L   Glucose, Bld 119 (H) 70 - 99 mg/dL   BUN 54 (H) 8 - 23 mg/dL   Creatinine, Ser 1.06 (H) 0.61 - 1.24 mg/dL   Calcium 8.6 (L) 8.9 - 10.3 mg/dL   GFR, Estimated 29 (L) >60 mL/min   Anion gap 13 5 - 15  CBC with Differential/Platelet     Status: Abnormal   Collection Time: 02/05/21  2:33 PM  Result Value Ref Range   WBC 19.2 (H) 4.0 - 10.5 K/uL    RBC 3.54 (L) 4.22 - 5.81 MIL/uL   Hemoglobin 11.8 (L) 13.0 - 17.0 g/dL   HCT 26.9 (L) 48.5 - 46.2 %   MCV 97.7 80.0 - 100.0 fL   MCH 33.3 26.0 - 34.0 pg   MCHC 34.1 30.0 - 36.0 g/dL   RDW 70.3 50.0 - 93.8 %   Platelets 183 150 - 400 K/uL   nRBC 0.0 0.0 - 0.2 %   Neutrophils Relative % 93 %  Neutro Abs 17.9 (H) 1.7 - 7.7 K/uL   Lymphocytes Relative 1 %   Lymphs Abs 0.2 (L) 0.7 - 4.0 K/uL   Monocytes Relative 5 %   Monocytes Absolute 0.9 0.1 - 1.0 K/uL   Eosinophils Relative 0 %   Eosinophils Absolute 0.0 0.0 - 0.5 K/uL   Basophils Relative 0 %   Basophils Absolute 0.1 0.0 - 0.1 K/uL   Immature Granulocytes 1 %   Abs Immature Granulocytes 0.15 (H) 0.00 - 0.07 K/uL  CK     Status: None   Collection Time: 02/05/21  2:34 PM  Result Value Ref Range   Total CK 277 49 - 397 U/L  Blood gas, venous     Status: Abnormal   Collection Time: 02/05/21  3:00 PM  Result Value Ref Range   FIO2 ROOM AIR    pH, Ven 7.30 7.250 - 7.430   pCO2, Ven 40 (L) 44.0 - 60.0 mmHg   pO2, Ven 43.0 32.0 - 45.0 mmHg   Bicarbonate 19.7 (L) 20.0 - 28.0 mmol/L   Acid-base deficit 6.3 (H) 0.0 - 2.0 mmol/L   O2 Saturation 72.7 %   Patient temperature 37.0    Collection site VENOUS    Sample type VENOUS   Lactic acid, plasma     Status: Abnormal   Collection Time: 02/05/21  3:57 PM  Result Value Ref Range   Lactic Acid, Venous 5.2 (HH) 0.5 - 1.9 mmol/L   Recent Results (from the past 240 hour(s))  SARS Coronavirus 2 by RT PCR (hospital order, performed in [facility] hospital lab) Nasopharyngeal Nasopharyngeal Swab     Status: None   Collection Time: 01/27/21 11:51 AM   Specimen: Nasopharyngeal Swab  Result Value Ref Range Status   SARS Coronavirus 2 NEGATIVE NEGATIVE Final    Comment: (NOTE) SARS-CoV-2 target nucleic acids are NOT DETECTED.  The SARS-CoV-2 RNA is generally detectable in upper and lower respiratory specimens during the acute phase of infection. The lowest concentration of SARS-CoV-2  viral copies this assay can detect is 250 copies / mL. A negative result does not preclude SARS-CoV-2 infection and should not be used as the sole basis for treatment or other patient management decisions.  A negative result may occur with improper specimen collection / handling, submission of specimen other than nasopharyngeal swab, presence of viral mutation(s) within the areas targeted by this assay, and inadequate number of viral copies (<250 copies / mL). A negative result must be combined with clinical observations, patient history, and epidemiological information.  Fact Sheet for Patients:   BoilerBrush.com.cyhttps://www.fda.gov/media/136312/download  Fact Sheet for Healthcare Providers: https://pope.com/https://www.fda.gov/media/136313/download  This test is not yet approved or  cleared by the [place] FDA and has been authorized for detection and/or diagnosis of SARS-CoV-2 by FDA under an Emergency Use Authorization (EUA).  This EUA will remain in effect (meaning this test can be used) for the duration of the COVID-19 declaration under Section 564(b)(1) of the Act, 21 U.S.C. section 360bbb-3(b)(1), unless the authorization is terminated or revoked sooner.  Performed at [facility], [address] [place], [place] [id number]   Resp Panel by RT-PCR (Flu A&B, Covid) Nasopharyngeal Swab     Status: None   Collection Time: 02/02/21 10:18 AM   Specimen: Nasopharyngeal Swab; Nasopharyngeal(NP) swabs in vial transport medium  Result Value Ref Range Status   SARS Coronavirus 2 by RT PCR NEGATIVE NEGATIVE Final    Comment: (NOTE) SARS-CoV-2 target nucleic acids are NOT DETECTED.  The SARS-CoV-2  RNA is generally detectable in upper respiratory specimens during the acute phase of infection. The lowest concentration of SARS-CoV-2 viral copies this assay can detect is 138 copies/mL. A negative result does not preclude SARS-Cov-2 infection and should not be used as the sole basis for treatment  or other patient management decisions. A negative result may occur with  improper specimen collection/handling, submission of specimen other than nasopharyngeal swab, presence of viral mutation(s) within the areas targeted by this assay, and inadequate number of viral copies(<138 copies/mL). A negative result must be combined with clinical observations, patient history, and epidemiological information. The expected result is Negative.  Fact Sheet for Patients:  BloggerCourse.com  Fact Sheet for Healthcare Providers:  SeriousBroker.it  This test is no t yet approved or cleared by the [place] FDA and  has been authorized for detection and/or diagnosis of SARS-CoV-2 by FDA under an Emergency Use Authorization (EUA). This EUA will remain  in effect (meaning this test can be used) for the duration of the COVID-19 declaration under Section 564(b)(1) of the Act, 21 U.S.C.section 360bbb-3(b)(1), unless the authorization is terminated  or revoked sooner.       Influenza A by PCR NEGATIVE NEGATIVE Final   Influenza B by PCR NEGATIVE NEGATIVE Final    Comment: (NOTE) The Xpert Xpress SARS-CoV-2/FLU/RSV plus assay is intended as an aid in the diagnosis of influenza from Nasopharyngeal swab specimens and should not be used as a sole basis for treatment. Nasal washings and aspirates are unacceptable for Xpert Xpress SARS-CoV-2/FLU/RSV testing.  Fact Sheet for Patients: BloggerCourse.com  Fact Sheet for Healthcare Providers: SeriousBroker.it  This test is not yet approved or cleared by the [place] FDA and has been authorized for detection and/or diagnosis of SARS-CoV-2 by FDA under an Emergency Use Authorization (EUA). This EUA will remain in effect (meaning this test can be used) for the duration of the COVID-19 declaration under Section 564(b)(1) of the Act, 21 U.S.C. section  360bbb-3(b)(1), unless the authorization is terminated or revoked.  Performed at [facility], [address] [place], [place] [address]     Renal Function: Recent Labs    01/30/21 2751 02/01/21 0810 02/02/21 0505 02/05/21 1252  CREATININE 1.81* 1.87* 1.87* 2.15*   Estimated Creatinine Clearance: 24.2 mL/min (A) (by C-G formula based on SCr of 2.15 mg/dL (H)).  Radiologic Imaging: DG Chest Port 1 View  Result Date: 02/05/2021 CLINICAL DATA:  Sepsis. EXAM: PORTABLE CHEST 1 VIEW COMPARISON:  01/27/2021 and older studies. FINDINGS: Cardiac silhouette is normal in size. No mediastinal or hilar masses. Prominent bronchovascular and interstitial lung markings, stable. No evidence of pneumonia or pulmonary edema. No pleural effusion or pneumothorax. Skeletal structures are grossly intact. IMPRESSION: No acute cardiopulmonary disease. Electronically Signed   By: Amie Portland M.D.   On: 02/05/2021 18:17   CT Renal Stone Study  Result Date: 02/05/2021 CLINICAL DATA:  Hematuria. EXAM: CT ABDOMEN AND PELVIS WITHOUT CONTRAST TECHNIQUE: Multidetector CT imaging of the abdomen and pelvis was performed following the standard protocol without IV contrast. COMPARISON:  None. FINDINGS: Lower chest: Trace bilateral pleural effusions. Mild increased subpleural reticulation at the lung bases. Minimal dependent subsegmental atelectasis in both lower lobes. Hepatobiliary: No focal liver abnormality is seen. No gallstones, gallbladder wall thickening, or biliary dilatation. Pancreas: Unremarkable. No pancreatic ductal dilatation or surrounding inflammatory changes. Spleen: Normal in size without focal abnormality. Adrenals/Urinary Tract: The adrenal glands are unremarkable. Multiple bilateral renal simple cysts measuring up to 8.9 cm on the right  and 9.3 cm on the left. 4.9 cm right and 4.0 cm left minimally complicated cysts with thin peripheral calcification (series 2, image 30). 1.4 cm high density  lesion in the left kidney, likely a hemorrhagic or proteinaceous cyst. Moderate right hydroureteronephrosis with several small foci of air within the collecting system. No left hydronephrosis. No renal calculi. Small amount of layering hyperdensity in the posterior bladder. Layering air within the anterior bladder related to recent instrumentation. There is also scattered air within the bladder wall, which is not significantly thickened. Stomach/Bowel: Stomach is within normal limits. Appendix appears normal. No evidence of bowel wall thickening, distention, or inflammatory changes. Severe sigmoid colonic diverticulosis. Vascular/Lymphatic: Aortic atherosclerosis. Infrarenal abdominal aortic aneurysm measuring up to 6.2 cm. 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 No enlarged abdominal or pelvic lymph nodes. Reproductive: Mild prostatomegaly with median lobe hypertrophy indenting the bladder base. Other: Prior right inguinal hernia repair. No free fluid her pneumoperitoneum. Mild presacral stranding. Musculoskeletal: No acute or significant osseous findings. Chronic L2 superior endplate compression fracture. IMPRESSION: 1. Small amount of layering hemorrhage in the posterior bladder. Scattered air within the bladder wall, suggestive of emphysematous cystitis. 2. Moderate right hydroureteronephrosis without evident obstructing lesion or calculi. Small foci of air within the right renal collecting system, favor reflux from emphysematous cystitis versus less likely emphysematous pyelitis given degree of air. 3. Infrarenal abdominal aortic aneurysm measuring up to 6.2 cm. Recommend referral to a vascular specialist. This recommendation follows ACR consensus guidelines: White Paper of the ACR Incidental Findings Committee II on Vascular Findings. J Am Coll Radiol 2013; 16:109-604. 4. Aortic Atherosclerosis (ICD10-I70.0). Electronically Signed   By: Obie Dredge M.D.    On: 02/05/2021 15:56    I independently reviewed the above imaging studies.  Impression/Recommendation 1. Gross hematuria: Due to traumatic Foley catheter removal with balloon inflated.  After Foley catheter replacement, his urine is light pink-tinged with no clots irrigated. 2.  BPH with LUTS: Treated outpatient with tamsulosin and finasteride. 3.  History of acute urinary retention: S/p Foley catheter placement 01/28/2021 for acute retention with bladder scan greater than 450 mL. 4.  Sepsis due to unknown source: Present with tachycardia, mild hypotension, leukocytosis of 19 and lactic acid of 5.2.  Cultures pending. 5.  AKI: Creatinine 2.15 on 02/05/2021 from a baseline of 1.8  -Required bedside flexible cystoscopy in order to gain entrance into the bladder given prior traumatic Foley catheter removal.  See separate procedure note.  22 French Councill catheter placed. -Irrigated foley with link pink tinge urine without clots. No difficulty irrigating. -Leave foley catheter in place at least 1 week given traumatic foley removal. -Recommend that he wear mittens while catheter in is place. -Manually irrigate foley every 4 hours for decreased drainage or clots. Order placed. -F/u urine and blood cx -Continue abx per primary -Will need outpatient followup either with his primary urologist or [facility].  Matt R. [place] Searles MD 02/05/2021, 7:36 PM  [facility]  Pager: [phone]   CC: Amy Cox, DO

## 2021-02-06 ENCOUNTER — Ambulatory Visit: Payer: Self-pay | Admitting: Urology

## 2021-02-06 ENCOUNTER — Ambulatory Visit: Payer: Self-pay

## 2021-02-06 DIAGNOSIS — Y738 Miscellaneous gastroenterology and urology devices associated with adverse incidents, not elsewhere classified: Secondary | ICD-10-CM | POA: Diagnosis present

## 2021-02-06 DIAGNOSIS — R7881 Bacteremia: Secondary | ICD-10-CM

## 2021-02-06 DIAGNOSIS — N401 Enlarged prostate with lower urinary tract symptoms: Secondary | ICD-10-CM

## 2021-02-06 DIAGNOSIS — N390 Urinary tract infection, site not specified: Secondary | ICD-10-CM

## 2021-02-06 DIAGNOSIS — F0390 Unspecified dementia without behavioral disturbance: Secondary | ICD-10-CM | POA: Diagnosis present

## 2021-02-06 DIAGNOSIS — Z7189 Other specified counseling: Secondary | ICD-10-CM | POA: Diagnosis not present

## 2021-02-06 DIAGNOSIS — R339 Retention of urine, unspecified: Secondary | ICD-10-CM | POA: Diagnosis not present

## 2021-02-06 DIAGNOSIS — R310 Gross hematuria: Secondary | ICD-10-CM

## 2021-02-06 DIAGNOSIS — B9620 Unspecified Escherichia coli [E. coli] as the cause of diseases classified elsewhere: Secondary | ICD-10-CM | POA: Diagnosis present

## 2021-02-06 DIAGNOSIS — N178 Other acute kidney failure: Secondary | ICD-10-CM | POA: Diagnosis not present

## 2021-02-06 DIAGNOSIS — B9562 Methicillin resistant Staphylococcus aureus infection as the cause of diseases classified elsewhere: Secondary | ICD-10-CM

## 2021-02-06 DIAGNOSIS — F419 Anxiety disorder, unspecified: Secondary | ICD-10-CM | POA: Diagnosis present

## 2021-02-06 DIAGNOSIS — N1832 Chronic kidney disease, stage 3b: Secondary | ICD-10-CM

## 2021-02-06 DIAGNOSIS — R338 Other retention of urine: Secondary | ICD-10-CM | POA: Diagnosis present

## 2021-02-06 DIAGNOSIS — Z87891 Personal history of nicotine dependence: Secondary | ICD-10-CM | POA: Diagnosis not present

## 2021-02-06 DIAGNOSIS — A4102 Sepsis due to Methicillin resistant Staphylococcus aureus: Secondary | ICD-10-CM | POA: Diagnosis present

## 2021-02-06 DIAGNOSIS — E871 Hypo-osmolality and hyponatremia: Secondary | ICD-10-CM | POA: Diagnosis present

## 2021-02-06 DIAGNOSIS — Z6822 Body mass index (BMI) 22.0-22.9, adult: Secondary | ICD-10-CM | POA: Diagnosis not present

## 2021-02-06 DIAGNOSIS — I2510 Atherosclerotic heart disease of native coronary artery without angina pectoris: Secondary | ICD-10-CM | POA: Diagnosis present

## 2021-02-06 DIAGNOSIS — Z7401 Bed confinement status: Secondary | ICD-10-CM | POA: Diagnosis not present

## 2021-02-06 DIAGNOSIS — I129 Hypertensive chronic kidney disease with stage 1 through stage 4 chronic kidney disease, or unspecified chronic kidney disease: Secondary | ICD-10-CM | POA: Diagnosis present

## 2021-02-06 DIAGNOSIS — R6521 Severe sepsis with septic shock: Secondary | ICD-10-CM | POA: Diagnosis present

## 2021-02-06 DIAGNOSIS — A419 Sepsis, unspecified organism: Secondary | ICD-10-CM | POA: Diagnosis present

## 2021-02-06 DIAGNOSIS — T83091A Other mechanical complication of indwelling urethral catheter, initial encounter: Secondary | ICD-10-CM | POA: Diagnosis present

## 2021-02-06 DIAGNOSIS — Z955 Presence of coronary angioplasty implant and graft: Secondary | ICD-10-CM | POA: Diagnosis not present

## 2021-02-06 DIAGNOSIS — I714 Abdominal aortic aneurysm, without rupture: Secondary | ICD-10-CM | POA: Diagnosis present

## 2021-02-06 DIAGNOSIS — Z515 Encounter for palliative care: Secondary | ICD-10-CM | POA: Diagnosis not present

## 2021-02-06 DIAGNOSIS — E43 Unspecified severe protein-calorie malnutrition: Secondary | ICD-10-CM | POA: Diagnosis present

## 2021-02-06 DIAGNOSIS — N179 Acute kidney failure, unspecified: Secondary | ICD-10-CM | POA: Diagnosis present

## 2021-02-06 DIAGNOSIS — N136 Pyonephrosis: Secondary | ICD-10-CM | POA: Diagnosis present

## 2021-02-06 DIAGNOSIS — Z20822 Contact with and (suspected) exposure to covid-19: Secondary | ICD-10-CM | POA: Diagnosis present

## 2021-02-06 DIAGNOSIS — Z79899 Other long term (current) drug therapy: Secondary | ICD-10-CM | POA: Diagnosis not present

## 2021-02-06 DIAGNOSIS — Z66 Do not resuscitate: Secondary | ICD-10-CM | POA: Diagnosis present

## 2021-02-06 LAB — BLOOD CULTURE ID PANEL (REFLEXED) - BCID2
A.calcoaceticus-baumannii: NOT DETECTED
Bacteroides fragilis: NOT DETECTED
CTX-M ESBL: NOT DETECTED
Candida albicans: NOT DETECTED
Candida auris: NOT DETECTED
Candida glabrata: NOT DETECTED
Candida krusei: NOT DETECTED
Candida parapsilosis: NOT DETECTED
Candida tropicalis: NOT DETECTED
Carbapenem resist OXA 48 LIKE: NOT DETECTED
Carbapenem resistance IMP: NOT DETECTED
Carbapenem resistance KPC: NOT DETECTED
Carbapenem resistance NDM: NOT DETECTED
Carbapenem resistance VIM: NOT DETECTED
Cryptococcus neoformans/gattii: NOT DETECTED
Enterobacter cloacae complex: NOT DETECTED
Enterobacterales: DETECTED — AB
Enterococcus Faecium: NOT DETECTED
Enterococcus faecalis: NOT DETECTED
Escherichia coli: DETECTED — AB
Haemophilus influenzae: NOT DETECTED
Klebsiella aerogenes: NOT DETECTED
Klebsiella oxytoca: NOT DETECTED
Klebsiella pneumoniae: NOT DETECTED
Listeria monocytogenes: NOT DETECTED
Meth resistant mecA/C and MREJ: DETECTED — AB
Neisseria meningitidis: NOT DETECTED
Proteus species: NOT DETECTED
Pseudomonas aeruginosa: NOT DETECTED
Salmonella species: NOT DETECTED
Serratia marcescens: NOT DETECTED
Staphylococcus aureus (BCID): DETECTED — AB
Staphylococcus epidermidis: NOT DETECTED
Staphylococcus lugdunensis: NOT DETECTED
Staphylococcus species: DETECTED — AB
Stenotrophomonas maltophilia: NOT DETECTED
Streptococcus agalactiae: NOT DETECTED
Streptococcus pneumoniae: NOT DETECTED
Streptococcus pyogenes: NOT DETECTED
Streptococcus species: NOT DETECTED

## 2021-02-06 LAB — CREATININE, SERUM
Creatinine, Ser: 2.38 mg/dL — ABNORMAL HIGH (ref 0.61–1.24)
GFR, Estimated: 25 mL/min — ABNORMAL LOW (ref >60)

## 2021-02-06 LAB — PROCALCITONIN: Procalcitonin: 85.72 ng/mL

## 2021-02-06 LAB — CK: Total CK: 162 U/L (ref 49–397)

## 2021-02-06 LAB — SARS CORONAVIRUS 2 (TAT 6-24 HRS): SARS Coronavirus 2: NEGATIVE

## 2021-02-06 LAB — LACTIC ACID, PLASMA: Lactic Acid, Venous: 1.3 mmol/L (ref 0.5–1.9)

## 2021-02-06 MED ORDER — ONDANSETRON 4 MG PO TBDP: 4.0000 mg | ORAL_TABLET | Freq: Four times a day (QID) | ORAL | Status: DC | PRN

## 2021-02-06 MED ORDER — HALOPERIDOL LACTATE 2 MG/ML PO CONC
2.0000 mg | ORAL | Status: DC | PRN
  Filled 2021-02-06: qty 1

## 2021-02-06 MED ORDER — POLYVINYL ALCOHOL 1.4 % OP SOLN
1.0000 [drp] | Freq: Four times a day (QID) | OPHTHALMIC | Status: DC | PRN
  Filled 2021-02-06: qty 15

## 2021-02-06 MED ORDER — HYDROMORPHONE HCL 1 MG/ML IJ SOLN
INTRAMUSCULAR | Status: AC
  Filled 2021-02-06: qty 1

## 2021-02-06 MED ORDER — LORAZEPAM 2 MG/ML PO CONC: 1.0000 mg | ORAL | Status: DC | PRN

## 2021-02-06 MED ORDER — BIOTENE DRY MOUTH MT LIQD: 15.0000 mL | OROMUCOSAL | Status: DC | PRN

## 2021-02-06 MED ORDER — HALOPERIDOL LACTATE 5 MG/ML IJ SOLN
INTRAMUSCULAR | Status: AC
  Administered 2021-02-06: 0.5 mg via INTRAVENOUS
  Filled 2021-02-06: qty 1

## 2021-02-06 MED ORDER — ACETAMINOPHEN 325 MG PO TABS: 650.0000 mg | ORAL_TABLET | Freq: Four times a day (QID) | ORAL | Status: DC | PRN

## 2021-02-06 MED ORDER — LORAZEPAM 1 MG PO TABS: 1.0000 mg | ORAL_TABLET | ORAL | Status: DC | PRN

## 2021-02-06 MED ORDER — HYDROMORPHONE HCL 1 MG/ML IJ SOLN
0.5000 mg | INTRAMUSCULAR | Status: DC | PRN
  Administered 2021-02-06: 0.5 mg via INTRAVENOUS

## 2021-02-06 MED ORDER — HALOPERIDOL 2 MG PO TABS
2.0000 mg | ORAL_TABLET | ORAL | Status: DC | PRN
  Filled 2021-02-06: qty 1

## 2021-02-06 MED ORDER — LORAZEPAM 2 MG/ML IJ SOLN
2.0000 mg | INTRAMUSCULAR | Status: DC | PRN
  Administered 2021-02-07: 2 mg via INTRAVENOUS
  Filled 2021-02-06: qty 1

## 2021-02-06 MED ORDER — HALOPERIDOL LACTATE 5 MG/ML IJ SOLN: 2.0000 mg | INTRAMUSCULAR | Status: DC | PRN

## 2021-02-06 MED ORDER — SODIUM CHLORIDE 0.9 % IV SOLN
2.0000 g | INTRAVENOUS | Status: DC
  Administered 2021-02-06: 2 g via INTRAVENOUS
  Filled 2021-02-06 (×2): qty 20

## 2021-02-06 MED ORDER — CHLORHEXIDINE GLUCONATE CLOTH 2 % EX PADS
6.0000 | MEDICATED_PAD | Freq: Every day | CUTANEOUS | Status: DC
  Administered 2021-02-07: 6 via TOPICAL

## 2021-02-06 MED ORDER — GLYCOPYRROLATE 0.2 MG/ML IJ SOLN
0.3000 mg | INTRAMUSCULAR | Status: DC | PRN
  Filled 2021-02-06: qty 1.5

## 2021-02-06 MED ORDER — VANCOMYCIN VARIABLE DOSE PER UNSTABLE RENAL FUNCTION (PHARMACIST DOSING): Status: DC

## 2021-02-06 MED ORDER — HALOPERIDOL LACTATE 2 MG/ML PO CONC
0.5000 mg | ORAL | Status: DC | PRN
  Filled 2021-02-06: qty 0.3

## 2021-02-06 MED ORDER — GLYCOPYRROLATE 1 MG PO TABS
2.0000 mg | ORAL_TABLET | ORAL | Status: DC | PRN
  Filled 2021-02-06: qty 2

## 2021-02-06 MED ORDER — LORAZEPAM 2 MG/ML PO CONC
1.0000 mg | ORAL | Status: DC | PRN
  Filled 2021-02-06: qty 0.5

## 2021-02-06 MED ORDER — SODIUM CHLORIDE 0.9% FLUSH
3.0000 mL | Freq: Two times a day (BID) | INTRAVENOUS | Status: DC
  Administered 2021-02-06 (×2): 3 mL via INTRAVENOUS

## 2021-02-06 MED ORDER — VANCOMYCIN HCL 750 MG/150ML IV SOLN: 750.0000 mg | Freq: Once | INTRAVENOUS | Status: DC

## 2021-02-06 MED ORDER — LORAZEPAM 2 MG/ML IJ SOLN
1.0000 mg | INTRAMUSCULAR | Status: DC | PRN
  Administered 2021-02-06: 1 mg via INTRAVENOUS
  Filled 2021-02-06: qty 1

## 2021-02-06 MED ORDER — HALOPERIDOL 0.5 MG PO TABS
0.5000 mg | ORAL_TABLET | ORAL | Status: DC | PRN
  Filled 2021-02-06: qty 1

## 2021-02-06 MED ORDER — HALOPERIDOL LACTATE 5 MG/ML IJ SOLN
0.5000 mg | INTRAMUSCULAR | Status: DC | PRN
  Administered 2021-02-06: 0.5 mg via INTRAVENOUS
  Filled 2021-02-06: qty 1

## 2021-02-06 MED ORDER — ONDANSETRON HCL 4 MG/2ML IJ SOLN: 4.0000 mg | Freq: Four times a day (QID) | INTRAMUSCULAR | Status: DC | PRN

## 2021-02-06 MED ORDER — ACETAMINOPHEN 650 MG RE SUPP: 650.0000 mg | Freq: Four times a day (QID) | RECTAL | Status: DC | PRN

## 2021-02-06 MED ORDER — HYDROMORPHONE HCL 1 MG/ML IJ SOLN: 0.2500 mg | INTRAMUSCULAR | Status: DC | PRN

## 2021-02-06 MED ORDER — SODIUM CHLORIDE 0.9% FLUSH: 3.0000 mL | INTRAVENOUS | Status: DC | PRN

## 2021-02-06 NOTE — ED Notes (Signed)
Pt foley irrigated with approx 200 ml sterile water, return was non-bloody, without clots, slight yellow-orange tinge

## 2021-02-06 NOTE — TOC Initial Note (Signed)
Transition of Care [facility]) - Initial/Assessment Note    Patient Details  Name: Andrew Bird MRN: [id number] Date of Birth: 1928/12/14  Transition of Care [facility]) CM/SW Contact:    Chapman Fitch, RN Phone Number: 02/06/2021, 3:18 PM  Clinical Narrative:                  Patient admitted from [facility] has spoken with wife and son, and a referral was made by palliative to [employer] [facility] for residential hospice     Potential bed availability tomorrow    Patient Goals and CMS Choice        Expected Discharge Plan and Services                                                Prior Living Arrangements/Services                       Activities of Daily Living Home Assistive Devices/Equipment: Bedside commode/3-in-1,Cane (specify quad or straight),Dentures (specify type),Eyeglasses,Hearing aid,Built-in shower seat,Splint (specify type) (upper and lower dentures; brace for left drop foot) ADL Screening (condition at time of admission) Patient's cognitive ability adequate to safely complete daily activities?: No Is the patient deaf or have difficulty hearing?: Yes Does the patient have difficulty seeing, even when wearing glasses/contacts?: No Does the patient have difficulty concentrating, remembering, or making decisions?: Yes Patient able to express need for assistance with ADLs?: No Does the patient have difficulty dressing or bathing?: Yes Independently performs ADLs?: No Communication: Needs assistance Is this a change from baseline?: Change from baseline, expected to last >3 days Dressing (OT): Needs assistance Is this a change from baseline?: Change from baseline, expected to last >3 days Grooming: Needs assistance Is this a change from baseline?: Change from baseline, expected to last >3 days Feeding: Independent Bathing: Needs assistance Is this a change from baseline?: Change from baseline, expected to last >3  days Toileting: Needs assistance Is this a change from baseline?: Change from baseline, expected to last >3days In/Out Bed: Needs assistance Is this a change from baseline?: Change from baseline, expected to last >3 days Walks in Home: Needs assistance Is this a change from baseline?: Change from baseline, expected to last >3 days Does the patient have difficulty walking or climbing stairs?: Yes Weakness of Legs: Both Weakness of Arms/Hands: Both  Permission Sought/Granted                  Emotional Assessment              Admission diagnosis:  Septic shock (HCC) [A41.9, R65.21] Sepsis (HCC) [A41.9] Foley catheter problem, initial encounter (HCC) [Y09.9XXA] Sepsis, due to unspecified organism, unspecified whether acute organ dysfunction present [facility]) [A41.9] Patient Active Problem List   Diagnosis Date Noted  . Septic shock (HCC) 02/06/2021  . Gross hematuria 02/06/2021  . Acute lower UTI 02/06/2021  . Severe sepsis with septic shock (HCC) 02/05/2021  . Aneurysm of infrarenal abdominal aorta (HCC) 02/05/2021  . Protein-calorie malnutrition, severe 02/01/2021  . Acute renal failure superimposed on stage 3b chronic kidney disease (HCC) 01/28/2021  . Urinary retention 01/28/2021  . BPH (benign prostatic hyperplasia)   . Hypertension   . Acute metabolic encephalopathy 01/27/2021   PCP:  Dione Housekeeper, MD Pharmacy:   [facility] [address], [place] -  [address] [place] [address] [place] [address] Phone: [phone] Fax: [phone]  [id number] [place] - [address] AT [facility] OF [address] [facility] [place] [address] Phone: [phone] Fax: [phone]     Social Determinants of Health (SDOH) Interventions    Readmission Risk Interventions No flowsheet data found.

## 2021-02-06 NOTE — Consult Note (Signed)
NAME: Andrew Bird  DOB: 11-17-1928  MRN: [id number]  Date/Time: 02/06/2021 1:03 PM  REQUESTING PROVIDER: Dr. Chipper HerbZhang Subjective:  REASON FOR CONSULT: MRSA bacteremia ?No History from patient- chart reviewed Andrew Bird is a 87 y.o. male with a history of senile dementia, hypertension, hyperlipidemia, BPH with indwelling Foley, subdural hematoma presents to the ED with chief concerns of bleeding from urinary catheter. Patient was recently in the hospital between 01/27/2021 until 02/02/2021 for acute metabolic encephalopathy.  CT head did not show any acute findings.  Postvoid residual scan showed 450 cc of urine.  Urology was consulted for urinary retention and a Foley catheter was placed with plans to follow-up with urology as outpatient. In the ED vitals were Patient had a clogged Foley catheter which was replaced and bladder was flushed out large quantities of blood clots by the urologist. In the ED vitals were temperature of 100.2, BP 99/63, heart rate of 87, respiratory rate 22, sats 95%. WBC was 19.2, Hb 11.8, platelet 183, creatinine 2.15.  Blood culture was sent.  It showed MRSA and E. coli and I am seeing the patient for the same.  Patient is currently on vancomycin and ceftriaxone.  CT of the kidneys showed small amount of layering hemorrhage in the posterior bladder.  Scattered air within the bladder wall suggestive of emphysematous cystitis.  Moderate right hydronephrosis without evident obstructing lesion or calculi.  There was also air in the right renal collecting system. An infrarenal aortic aneurysm measuring 6.2 cm was seen. I am seeing the patient for MRSA bacteremia Past Medical History:  Diagnosis Date  . BPH (benign prostatic hyperplasia)   . Bradycardia   . Coronary artery disease   . Enlarged prostate   . Hypertension   . Renal disorder   . Senile dementia [facility](HCC)     Past Surgical History:  Procedure Laterality Date  . BURR HOLE FOR SUBDURAL HEMATOMA  11/22/2015   . CATARACT EXTRACTION, BILATERAL    . coronary artery stent    . HERNIA REPAIR  2008    Social History   Socioeconomic History  . Marital status: Married    Spouse name: Not on file  . Number of children: Not on file  . Years of education: Not on file  . Highest education level: Not on file  Occupational History  . Not on file  Tobacco Use  . Smoking status: Former Games developermoker  . Smokeless tobacco: Never Used  Substance and Sexual Activity  . Alcohol use: No  . Drug use: Not on file  . Sexual activity: Not on file  Other Topics Concern  . Not on file  Social History Narrative  . Not on file   Social Determinants of Health   Financial Resource Strain: Not on file  Food Insecurity: Not on file  Transportation Needs: Not on file  Physical Activity: Not on file  Stress: Not on file  Social Connections: Not on file  Intimate Partner Violence: Not on file    History reviewed. No pertinent family history. Allergies  Allergen Reactions  . Aspirin Other (See Comments)    Other reaction(s): Blood Disorder Intracranial bleed Intracranial bleed     Current Facility-Administered Medications  Medication Dose Route Frequency Provider Last Rate Last Admin  . acetaminophen (TYLENOL) tablet 325 mg  325 mg Oral Q6H PRN Cox, Amy N, DO       Or  . acetaminophen (TYLENOL) suppository 325 mg  325 mg Rectal Q6H PRN Cox, Amy N, DO      .  amLODipine (NORVASC) tablet 10 mg  10 mg Oral Daily Cox, Amy N, DO      . atorvastatin (LIPITOR) tablet 40 mg  40 mg Oral QPM Cox, Amy N, DO      . cefTRIAXone (ROCEPHIN) 2 g in sodium chloride 0.9 % 100 mL IVPB  2 g Intravenous Q24H Manuela Schwartz, NP   Stopped at 02/06/21 0535  . [START ON 02/07/2021] Chlorhexidine Gluconate Cloth 2 % PADS 6 each  6 each Topical Q0600 Marrion Coy, MD      . finasteride (PROSCAR) tablet 5 mg  5 mg Oral Daily Cox, Amy N, DO      . hydrALAZINE (APRESOLINE) tablet 25 mg  25 mg Oral Q8H Cox, Amy N, DO      . lactated  ringers infusion   Intravenous Continuous Cox, Amy N, DO 125 mL/hr at 02/05/21 1845 New Bag at 02/05/21 1845  . levETIRAcetam (KEPPRA) IVPB 500 mg/100 mL premix  500 mg Intravenous Q12H Cox, Amy N, DO   Stopped at 02/06/21 0705  . lisinopril (ZESTRIL) tablet 5 mg  5 mg Oral Daily Cox, Amy N, DO      . ondansetron (ZOFRAN) tablet 4 mg  4 mg Oral Q6H PRN Cox, Amy N, DO       Or  . ondansetron (ZOFRAN) injection 4 mg  4 mg Intravenous Q6H PRN Cox, Amy N, DO      . sertraline (ZOLOFT) tablet 75 mg  75 mg Oral Daily Cox, Amy N, DO      . tamsulosin (FLOMAX) capsule 0.4 mg  0.4 mg Oral QHS Cox, Amy N, DO      . traZODone (DESYREL) tablet 50 mg  50 mg Oral QHS Cox, Amy N, DO      . vancomycin variable dose per unstable renal function (pharmacist dosing)   Does not apply See admin instructions Marrion Coy, MD         Abtx:  Anti-infectives (From admission, onward)   Start     Dose/Rate Route Frequency Ordered Stop   02/06/21 1003  vancomycin variable dose per unstable renal function (pharmacist dosing)         Does not apply See admin instructions 02/06/21 1003     02/06/21 0400  ceFEPIme (MAXIPIME) 2 g in sodium chloride 0.9 % 100 mL IVPB  Status:  Discontinued        2 g 200 mL/hr over 30 Minutes Intravenous Every 24 hours 02/05/21 1800 02/06/21 0309   02/06/21 0400  cefTRIAXone (ROCEPHIN) 2 g in sodium chloride 0.9 % 100 mL IVPB        2 g 200 mL/hr over 30 Minutes Intravenous Every 24 hours 02/06/21 0309     02/05/21 1758  vancomycin variable dose per unstable renal function (pharmacist dosing)  Status:  Discontinued         Does not apply See admin instructions 02/05/21 1800 02/06/21 0922   02/05/21 1730  ceFEPIme (MAXIPIME) 2 g in sodium chloride 0.9 % 100 mL IVPB  Status:  Discontinued        2 g 200 mL/hr over 30 Minutes Intravenous  Once 02/05/21 1724 02/05/21 1759   02/05/21 1730  metroNIDAZOLE (FLAGYL) IVPB 500 mg  Status:  Discontinued        500 mg 100 mL/hr over 60 Minutes  Intravenous Every 8 hours 02/05/21 1724 02/06/21 0309   02/05/21 1730  vancomycin (VANCOCIN) IVPB 1000 mg/200 mL premix  Status:  Discontinued  1,000 mg 200 mL/hr over 60 Minutes Intravenous  Once 02/05/21 1724 02/05/21 1759   02/05/21 1545  vancomycin (VANCOREADY) IVPB 1500 mg/300 mL        1,500 mg 150 mL/hr over 120 Minutes Intravenous  Once 02/05/21 1521 02/05/21 1858   02/05/21 1530  meropenem (MERREM) 1 g in sodium chloride 0.9 % 100 mL IVPB  Status:  Discontinued        1 g 200 mL/hr over 30 Minutes Intravenous Every 12 hours 02/05/21 1524 02/05/21 1800      REVIEW OF SYSTEMS:   NA Objective:  VITALS:  BP (!) 180/136   Pulse 65   Temp 98.4 F (36.9 C)   Resp 17   Ht 6' (1.829 m)   Wt 74.9 kg   SpO2 99%   BMI 22.39 kg/m  PHYSICAL EXAM:  General: lethargic,confused, ill, pale Head: Normocephalic, without obvious abnormality, atraumatic. Eyes: Conjunctivae clear, anicteric sclerae. Pupils are equal ENT Nares normal. No drainage or sinus tenderness. Cannot examine Neck:  symmetrical,  no carotid bruit and no JVD. Back: could not examine Lungs: b/l air entry Heart: Regular rate and rhythm, no murmur, rub or gallop. Abdomen: Soft, Foley in place  Extremities: atraumatic, no cyanosis. No edema. No clubbing Skin: No rashes  Neurologic: cannot assess Pertinent Labs Lab Results CBC    Component Value Date/Time   WBC 19.2 (H) 02/05/2021 1433   RBC 3.54 (L) 02/05/2021 1433   HGB 11.8 (L) 02/05/2021 1433   HGB 16.3 03/23/2013 1039   HCT 34.6 (L) 02/05/2021 1433   HCT 48.9 03/23/2013 1039   PLT 183 02/05/2021 1433   PLT 154 03/23/2013 1039   MCV 97.7 02/05/2021 1433   MCV 97 03/23/2013 1039   MCH 33.3 02/05/2021 1433   MCHC 34.1 02/05/2021 1433   RDW 12.8 02/05/2021 1433   RDW 14.0 03/23/2013 1039   LYMPHSABS 0.2 (L) 02/05/2021 1433   LYMPHSABS 1.5 03/23/2013 1039   MONOABS 0.9 02/05/2021 1433   MONOABS 0.7 03/23/2013 1039   EOSABS 0.0 02/05/2021 1433    EOSABS 0.4 03/23/2013 1039   BASOSABS 0.1 02/05/2021 1433   BASOSABS 0.1 03/23/2013 1039    CMP Latest Ref Rng & Units 02/06/2021 02/05/2021 02/02/2021  Glucose 70 - 99 mg/dL - 161(W) 960(A)  BUN 8 - 23 mg/dL - 54(U) 98(J)  Creatinine 0.61 - 1.24 mg/dL 1.91(Y) 7.82(N) 5.62(Z)  Sodium 135 - 145 mmol/L - 132(L) 134(L)  Potassium 3.5 - 5.1 mmol/L - 4.4 3.6  Chloride 98 - 111 mmol/L - 103 102  CO2 22 - 32 mmol/L - 16(L) 23  Calcium 8.9 - 10.3 mg/dL - 8.6(L) 8.7(L)  Total Protein 6.5 - 8.1 g/dL - - -  Total Bilirubin 0.3 - 1.2 mg/dL - - -  Alkaline Phos 38 - 126 U/L - - -  AST 15 - 41 U/L - - -  ALT 0 - 44 U/L - - -      Microbiology: Recent Results (from the past 240 hour(s))  Resp Panel by RT-PCR (Flu A&B, Covid) Nasopharyngeal Swab     Status: None   Collection Time: 02/02/21 10:18 AM   Specimen: Nasopharyngeal Swab; Nasopharyngeal(NP) swabs in vial transport medium  Result Value Ref Range Status   SARS Coronavirus 2 by RT PCR NEGATIVE NEGATIVE Final    Comment: (NOTE) SARS-CoV-2 target nucleic acids are NOT DETECTED.  The SARS-CoV-2 RNA is generally detectable in upper respiratory specimens during the acute phase of infection. The lowest concentration  of SARS-CoV-2 viral copies this assay can detect is 138 copies/mL. A negative result does not preclude SARS-Cov-2 infection and should not be used as the sole basis for treatment or other patient management decisions. A negative result may occur with  improper specimen collection/handling, submission of specimen other than nasopharyngeal swab, presence of viral mutation(s) within the areas targeted by this assay, and inadequate number of viral copies(<138 copies/mL). A negative result must be combined with clinical observations, patient history, and epidemiological information. The expected result is Negative.  Fact Sheet for Patients:  BloggerCourse.com  Fact Sheet for Healthcare Providers:   SeriousBroker.it  This test is no t yet approved or cleared by the [place] FDA and  has been authorized for detection and/or diagnosis of SARS-CoV-2 by FDA under an Emergency Use Authorization (EUA). This EUA will remain  in effect (meaning this test can be used) for the duration of the COVID-19 declaration under Section 564(b)(1) of the Act, 21 U.S.C.section 360bbb-3(b)(1), unless the authorization is terminated  or revoked sooner.       Influenza A by PCR NEGATIVE NEGATIVE Final   Influenza B by PCR NEGATIVE NEGATIVE Final    Comment: (NOTE) The Xpert Xpress SARS-CoV-2/FLU/RSV plus assay is intended as an aid in the diagnosis of influenza from Nasopharyngeal swab specimens and should not be used as a sole basis for treatment. Nasal washings and aspirates are unacceptable for Xpert Xpress SARS-CoV-2/FLU/RSV testing.  Fact Sheet for Patients: BloggerCourse.com  Fact Sheet for Healthcare Providers: SeriousBroker.it  This test is not yet approved or cleared by the [place] FDA and has been authorized for detection and/or diagnosis of SARS-CoV-2 by FDA under an Emergency Use Authorization (EUA). This EUA will remain in effect (meaning this test can be used) for the duration of the COVID-19 declaration under Section 564(b)(1) of the Act, 21 U.S.C. section 360bbb-3(b)(1), unless the authorization is terminated or revoked.  Performed at [facility], [address], [place], [place] [address]   Urine culture     Status: Abnormal (Preliminary result)   Collection Time: 02/05/21  1:08 PM   Specimen: Urine, Random  Result Value Ref Range Status   Specimen Description   Final    URINE, RANDOM Performed at [facility], [address], [place], [place] [address]    Special Requests   Final    NONE Performed at [facility], [address], [place], [place]  [address]    Culture (A)  Final    >=100,000 COLONIES/mL GRAM NEGATIVE RODS CULTURE REINCUBATED FOR BETTER GROWTH SUSCEPTIBILITIES TO FOLLOW Performed at [facility], [address], [place], [place] [address]    Report Status PENDING  Incomplete  Culture, blood (routine x 2)     Status: None (Preliminary result)   Collection Time: 02/05/21  3:57 PM   Specimen: BLOOD  Result Value Ref Range Status   Specimen Description   Final    BLOOD BLOOD RIGHT ARM Performed at [facility], [address] [place], [place] [address]    Special Requests   Final    BOTTLES DRAWN AEROBIC AND ANAEROBIC Blood Culture adequate volume Performed at [facility], [address] [place], [place] [address]    Culture  Setup Time   Final    Organism ID to follow GRAM NEGATIVE RODS GRAM POSITIVE COCCI IN BOTH AEROBIC AND ANAEROBIC BOTTLES CRITICAL RESULT CALLED TO, READ BACK BY AND VERIFIED WITH: NATHAN BELUE AT 0223 02/06/21 MF.  Culture GRAM NEGATIVE RODS GRAM POSITIVE COCCI  Final   Report Status PENDING  Incomplete  Culture, blood (routine x 2)     Status: None (Preliminary result)   Collection Time: 02/05/21  3:57 PM   Specimen: BLOOD  Result Value Ref Range Status   Specimen Description   Final    BLOOD RIGHT ANTECUBITAL Performed at [facility], [address] [place], [place] [address]    Special Requests   Final    BOTTLES DRAWN AEROBIC AND ANAEROBIC Blood Culture results may not be optimal due to an excessive volume of blood received in culture bottles Performed at [facility], [address], [place], [place] [address]    Culture  Setup Time   Final    GRAM NEGATIVE RODS GRAM POSITIVE COCCI IN BOTH AEROBIC AND ANAEROBIC BOTTLES CRITICAL RESULT CALLED TO, READ BACK BY AND VERIFIED WITH: NATHAN BELUE AT 0223 02/06/21 MF.    Culture GRAM NEGATIVE RODS GRAM POSITIVE COCCI  Final   Report Status PENDING  Incomplete  Blood Culture ID Panel  (Reflexed)     Status: Abnormal   Collection Time: 02/05/21  3:57 PM  Result Value Ref Range Status   Enterococcus faecalis NOT DETECTED NOT DETECTED Final   Enterococcus Faecium NOT DETECTED NOT DETECTED Final   Listeria monocytogenes NOT DETECTED NOT DETECTED Final   Staphylococcus species DETECTED (A) NOT DETECTED Final    Comment: CRITICAL RESULT CALLED TO, READ BACK BY AND VERIFIED WITH: NATHAN BELUE AT 0223 02/06/21 MF.    Staphylococcus aureus (BCID) DETECTED (A) NOT DETECTED Corrected    Comment: Methicillin (oxacillin)-resistant Staphylococcus aureus (MRSA). MRSA is predictably resistant to beta-lactam antibiotics (except ceftaroline). Preferred therapy is vancomycin unless clinically contraindicated. Patient requires contact precautions if  hospitalized. CRITICAL RESULT CALLED TO, READ BACK BY AND VERIFIED WITH: NATHAN BELUE AT 0223 02/06/21 MF. CORRECTED ON 02/14 AT 0755: PREVIOUSLY REPORTED AS DETECTED Methicillin (oxacillin) resistant Staphylococcus aureus (MRSA). MRSA is predictably resistant to beta lactam antibiotics (except ceftaroline). Preferred therapy is vancomycin unless clinically  contraindicated. Patient requires contact precautions if hospitalized. RBV NATHAN BELUE AT 0223 02/06/21 MF.    Staphylococcus epidermidis NOT DETECTED NOT DETECTED Final   Staphylococcus lugdunensis NOT DETECTED NOT DETECTED Final   Streptococcus species NOT DETECTED NOT DETECTED Final   Streptococcus agalactiae NOT DETECTED NOT DETECTED Final   Streptococcus pneumoniae NOT DETECTED NOT DETECTED Final   Streptococcus pyogenes NOT DETECTED NOT DETECTED Final   A.calcoaceticus-baumannii NOT DETECTED NOT DETECTED Final   Bacteroides fragilis NOT DETECTED NOT DETECTED Final   Enterobacterales DETECTED (A) NOT DETECTED Final    Comment: Enterobacterales represent a large order of gram negative bacteria, not a single organism. CRITICAL RESULT CALLED TO, READ BACK BY AND VERIFIED WITH: NATHAN  BELUE AT 0223 02/06/21 MF    Enterobacter cloacae complex NOT DETECTED NOT DETECTED Final   Escherichia coli DETECTED (A) NOT DETECTED Final    Comment: CRITICAL RESULT CALLED TO, READ BACK BY AND VERIFIED WITH: NATHAN BELUE AT 222/02/06/22 MF.    Klebsiella aerogenes NOT DETECTED NOT DETECTED Final   Klebsiella oxytoca NOT DETECTED NOT DETECTED Final   Klebsiella pneumoniae NOT DETECTED NOT DETECTED Final   Proteus species NOT DETECTED NOT DETECTED Final   Salmonella species NOT DETECTED NOT DETECTED Final   Serratia marcescens NOT DETECTED NOT DETECTED Final   Haemophilus influenzae NOT DETECTED NOT DETECTED Final   Neisseria meningitidis NOT DETECTED NOT DETECTED Final   Pseudomonas aeruginosa NOT DETECTED NOT  DETECTED Final   Stenotrophomonas maltophilia NOT DETECTED NOT DETECTED Final   Candida albicans NOT DETECTED NOT DETECTED Final   Candida auris NOT DETECTED NOT DETECTED Final   Candida glabrata NOT DETECTED NOT DETECTED Final   Candida krusei NOT DETECTED NOT DETECTED Final   Candida parapsilosis NOT DETECTED NOT DETECTED Final   Candida tropicalis NOT DETECTED NOT DETECTED Final   Cryptococcus neoformans/gattii NOT DETECTED NOT DETECTED Final   CTX-M ESBL NOT DETECTED NOT DETECTED Final   Carbapenem resistance IMP NOT DETECTED NOT DETECTED Final   Carbapenem resistance KPC NOT DETECTED NOT DETECTED Final   Meth resistant mecA/C and MREJ DETECTED (A) NOT DETECTED Final    Comment: CRITICAL RESULT CALLED TO, READ BACK BY AND VERIFIED WITH: NATHAN BELUE AT 222/02/06/22 MF.    Carbapenem resistance NDM NOT DETECTED NOT DETECTED Final   Carbapenem resist OXA 48 LIKE NOT DETECTED NOT DETECTED Final   Carbapenem resistance VIM NOT DETECTED NOT DETECTED Final    Comment: Performed at [facility], [address] [place], [place] [address]  SARS CORONAVIRUS 2 (TAT 6-24 HRS) Nasopharyngeal Nasopharyngeal Swab     Status: None   Collection Time: 02/05/21  5:01 PM    Specimen: Nasopharyngeal Swab  Result Value Ref Range Status   SARS Coronavirus 2 NEGATIVE NEGATIVE Final    Comment: (NOTE) SARS-CoV-2 target nucleic acids are NOT DETECTED.  The SARS-CoV-2 RNA is generally detectable in upper and lower respiratory specimens during the acute phase of infection. Negative results do not preclude SARS-CoV-2 infection, do not rule out co-infections with other pathogens, and should not be used as the sole basis for treatment or other patient management decisions. Negative results must be combined with clinical observations, patient history, and epidemiological information. The expected result is Negative.  Fact Sheet for Patients: HairSlick.no  Fact Sheet for Healthcare Providers: quierodirigir.com  This test is not yet approved or cleared by the [place] FDA and  has been authorized for detection and/or diagnosis of SARS-CoV-2 by FDA under an Emergency Use Authorization (EUA). This EUA will remain  in effect (meaning this test can be used) for the duration of the COVID-19 declaration under Se ction 564(b)(1) of the Act, 21 U.S.C. section 360bbb-3(b)(1), unless the authorization is terminated or revoked sooner.  Performed at [facility], [address] [place], [place] [address]     IMAGING RESULTS:  I have personally reviewed the films Moderate right hydroureteronephrosis without evident obstructing lesion or calculi. Small foci of air within the right renal collecting system, favor reflux from emphysematous cystitis versus less likely emphysematous pyelitis given degree of air.? Moderate right hydroureteronephrosis without evident obstructing lesion or calculi. Small foci of air within the right renal collecting system, favor reflux from emphysematous cystitis versus less likely emphysematous pyelitis given degree of air. ? ?Impression/recommendation Hematuria due Traumatic  foley manipulation ED changed catheter and expressed clots  MRSA bacteremia and E.coli bacteremia On vanco and ceftriaxone  Will need repeat blood cultures 2 d echo Complicated UTI due to E.coli. 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

## 2021-02-06 NOTE — ED Notes (Signed)
foley irrigated with approx 200 ml sterile water, return was non-bloody and slightly dark, no clots seen

## 2021-02-06 NOTE — ED Notes (Signed)
foley irrigated with approx 200 ml sterile water, return was blood colored, no clots visible

## 2021-02-06 NOTE — Progress Notes (Addendum)
PROGRESS NOTE    Andrew Bird  JQZ:[id number] DOB: 1928/09/26 DOA: 02/05/2021 PCP: Valera Castle, MD   Chief complaint: Gross Hematuria. Brief Narrative:  Andrew Bird is a 87 y.o. male with medical history significant for hypertension, hyperlipidemia, BPH, subdural hematoma, requiring bur hole in November 2016, dementia, presented to the emergency department for chief concerns of bleeding from urinary catheter. Upon arriving the emergency room, he met  septic shock criteria with a lactic acid level of 5.2. He was given fluids, and broad-spectrum antibiotics. The infection was thought to be from a urinary source. He was seen by urology, the clogged Foley catheter was replaced, bladder was flushed out large quantity of blood clots.   Assessment & Plan:   Principal Problem:   Sepsis ([place]) Active Problems:   Acute metabolic encephalopathy   AKI (acute kidney injury) ([place])   Urinary retention   BPH (benign prostatic hyperplasia)   Hypertension   Protein-calorie malnutrition, severe   Aneurysm of infrarenal abdominal aorta ([place])  #1. Septic shock. Secondary to urinary tract infection. MRSA septicemia. Urinary tract infection secondary to blocked urinary catheter and a large hematuria. Gross hematuria probably from trauma. Mild hydronephrosis secondary to obstruction from hematuria. Urinary retention. Patient had a septic shock with a lactic acid of 5.2. He also had a tachypnea and tachycardia and acute renal failure. This is secondary to gross hematuria with obstruction. Bladder has been flushed by urology, with blood clots removed. At this point, I will continue with Rocephin and vancomycin.  #2. Acute kidney injury on chronic kidney disease stage IIIb. Hyponatremia Reviewed prior record, patient baseline creatinine level was 1.8. Worsening renal function due to obstruction. I will continue some IV fluids today.  #3. severe dementia. Aspiration risk. Severe  protein calorie malnutrition. Patient is bedbound at baseline. We will obtain bedside swallow evaluation, start a diet. Patient has a very poor prognosis, will obtain palliative care consult. He probably can be a hospice candidate. Dietician consult.   1400.  Seen by palliative care, transition to comfort care treatment.  Hospice consulted for possible transfer.  DVT prophylaxis: SCds Code Status: DNR Family Communication: Unable to reach family Disposition Plan:  .   Status is: Observation  The patient will require care spanning > 2 midnights and should be moved to inpatient because: Inpatient level of care appropriate due to severity of illness  Dispo: The patient is from: ALF              Anticipated d/c is to: ALF              Anticipated d/c date is: 2 days              Patient currently is not medically stable to d/c.   Difficult to place patient No        I/O last 3 completed shifts: In: 1300 [IV Piggyback:1300] Out: -  Total I/O In: 100 [IV Piggyback:100] Out: -      Consultants:   Urology  Procedures: Foley  Antimicrobials:Rocephin  Subjective: Patient is very confused, hematuria is better. Foley in place. Patient does not appear to have any short of breath. No fever chills. No diarrhea abdominal pain. Not able to review of system due to dementia.  Objective: Vitals:   02/06/21 0300 02/06/21 0315 02/06/21 0400 02/06/21 0827  BP:   (!) 91/58 106/69  Pulse: 78 85 74 74  Resp: (!) 23 (!) 23 (!) 21 (!) 22  Temp:  TempSrc:      SpO2: 96% 94% 96% 98%  Weight:      Height:        Intake/Output Summary (Last 24 hours) at 02/06/2021 0925 Last data filed at 02/06/2021 0705 Gross per 24 hour  Intake 1400 ml  Output --  Net 1400 ml   Filed Weights   02/05/21 1250  Weight: 74.9 kg    Examination:  General exam: Appears calm and comfortable  Respiratory system: Clear to auscultation. Respiratory effort normal. Cardiovascular system: S1 &  S2 heard, RRR. No JVD, murmurs, rubs, gallops or clicks. No pedal edema. Gastrointestinal system: Abdomen is nondistended, soft and nontender. No organomegaly or masses felt. Normal bowel sounds heard. Central nervous system: Sleepy and confused. No focal neurological deficits. Extremities: Symmetric 5 x 5 power. Skin: No rashes, lesions or ulcers      Data Reviewed: I have personally reviewed following labs and imaging studies  CBC: Recent Labs  Lab 02/01/21 0439 02/02/21 0505 02/05/21 1433  WBC 10.6* 9.8 19.2*  NEUTROABS  --  7.2 17.9*  HGB 13.0 12.9* 11.8*  HCT 37.1* 36.0* 34.6*  MCV 95.1 94.7 97.7  PLT 139* 140* 528   Basic Metabolic Panel: Recent Labs  Lab 02/01/21 0810 02/02/21 0505 02/05/21 1252  NA 135 134* 132*  K 4.0 3.6 4.4  CL 106 102 103  CO2 21* 23 16*  GLUCOSE 130* 131* 119*  BUN 33* 42* 54*  CREATININE 1.87* 1.87* 2.15*  CALCIUM 8.6* 8.7* 8.6*   GFR: Estimated Creatinine Clearance: 24.2 mL/min (A) (by C-G formula based on SCr of 2.15 mg/dL (H)). Liver Function Tests: No results for input(s): AST, ALT, ALKPHOS, BILITOT, PROT, ALBUMIN in the last 168 hours. No results for input(s): LIPASE, AMYLASE in the last 168 hours. No results for input(s): AMMONIA in the last 168 hours. Coagulation Profile: No results for input(s): INR, PROTIME in the last 168 hours. Cardiac Enzymes: Recent Labs  Lab 02/05/21 1434  CKTOTAL 277   BNP (last 3 results) No results for input(s): PROBNP in the last 8760 hours. HbA1C: No results for input(s): HGBA1C in the last 72 hours. CBG: No results for input(s): GLUCAP in the last 168 hours. Lipid Profile: No results for input(s): CHOL, HDL, LDLCALC, TRIG, CHOLHDL, LDLDIRECT in the last 72 hours. Thyroid Function Tests: Recent Labs    02/05/21 1433  TSH 2.096   Anemia Panel: No results for input(s): VITAMINB12, FOLATE, FERRITIN, TIBC, IRON, RETICCTPCT in the last 72 hours. Sepsis Labs: Recent Labs  Lab  02/05/21 1433 02/05/21 1557 02/06/21 0500  PROCALCITON 1.07  --  85.72  LATICACIDVEN  --  5.2* 1.3    Recent Results (from the past 240 hour(s))  SARS Coronavirus 2 by RT PCR (hospital order, performed in [facility] hospital lab) Nasopharyngeal Nasopharyngeal Swab     Status: None   Collection Time: 01/27/21 11:51 AM   Specimen: Nasopharyngeal Swab  Result Value Ref Range Status   SARS Coronavirus 2 NEGATIVE NEGATIVE Final    Comment: (NOTE) SARS-CoV-2 target nucleic acids are NOT DETECTED.  The SARS-CoV-2 RNA is generally detectable in upper and lower respiratory specimens during the acute phase of infection. The lowest concentration of SARS-CoV-2 viral copies this assay can detect is 250 copies / mL. A negative result does not preclude SARS-CoV-2 infection and should not be used as the sole basis for treatment or other patient management decisions.  A negative result may occur with improper specimen collection / handling, submission of  specimen other than nasopharyngeal swab, presence of viral mutation(s) within the areas targeted by this assay, and inadequate number of viral copies (<250 copies / mL). A negative result must be combined with clinical observations, patient history, and epidemiological information.  Fact Sheet for Patients:   StrictlyIdeas.no  Fact Sheet for Healthcare Providers: BankingDealers.co.za  This test is not yet approved or  cleared by the [place] FDA and has been authorized for detection and/or diagnosis of SARS-CoV-2 by FDA under an Emergency Use Authorization (EUA).  This EUA will remain in effect (meaning this test can be used) for the duration of the COVID-19 declaration under Section 564(b)(1) of the Act, 21 U.S.C. section 360bbb-3(b)(1), unless the authorization is terminated or revoked sooner.  Performed at [facility], [address] [place], [place] [address]   Resp  Panel by RT-PCR (Flu A&B, Covid) Nasopharyngeal Swab     Status: None   Collection Time: 02/02/21 10:18 AM   Specimen: Nasopharyngeal Swab; Nasopharyngeal(NP) swabs in vial transport medium  Result Value Ref Range Status   SARS Coronavirus 2 by RT PCR NEGATIVE NEGATIVE Final    Comment: (NOTE) SARS-CoV-2 target nucleic acids are NOT DETECTED.  The SARS-CoV-2 RNA is generally detectable in upper respiratory specimens during the acute phase of infection. The lowest concentration of SARS-CoV-2 viral copies this assay can detect is 138 copies/mL. A negative result does not preclude SARS-Cov-2 infection and should not be used as the sole basis for treatment or other patient management decisions. A negative result may occur with  improper specimen collection/handling, submission of specimen other than nasopharyngeal swab, presence of viral mutation(s) within the areas targeted by this assay, and inadequate number of viral copies(<138 copies/mL). A negative result must be combined with clinical observations, patient history, and epidemiological information. The expected result is Negative.  Fact Sheet for Patients:  EntrepreneurPulse.com.au  Fact Sheet for Healthcare Providers:  IncredibleEmployment.be  This test is no t yet approved or cleared by the [place] FDA and  has been authorized for detection and/or diagnosis of SARS-CoV-2 by FDA under an Emergency Use Authorization (EUA). This EUA will remain  in effect (meaning this test can be used) for the duration of the COVID-19 declaration under Section 564(b)(1) of the Act, 21 U.S.C.section 360bbb-3(b)(1), unless the authorization is terminated  or revoked sooner.       Influenza A by PCR NEGATIVE NEGATIVE Final   Influenza B by PCR NEGATIVE NEGATIVE Final    Comment: (NOTE) The Xpert Xpress SARS-CoV-2/FLU/RSV plus assay is intended as an aid in the diagnosis of influenza from Nasopharyngeal  swab specimens and should not be used as a sole basis for treatment. Nasal washings and aspirates are unacceptable for Xpert Xpress SARS-CoV-2/FLU/RSV testing.  Fact Sheet for Patients: EntrepreneurPulse.com.au  Fact Sheet for Healthcare Providers: IncredibleEmployment.be  This test is not yet approved or cleared by the [place] FDA and has been authorized for detection and/or diagnosis of SARS-CoV-2 by FDA under an Emergency Use Authorization (EUA). This EUA will remain in effect (meaning this test can be used) for the duration of the COVID-19 declaration under Section 564(b)(1) of the Act, 21 U.S.C. section 360bbb-3(b)(1), unless the authorization is terminated or revoked.  Performed at [facility], [address] [place], [place] [address]   Culture, blood (routine x 2)     Status: None (Preliminary result)   Collection Time: 02/05/21  3:57 PM   Specimen: BLOOD  Result Value Ref Range Status   Specimen  Description   Final    BLOOD BLOOD RIGHT ARM Performed at [facility], [address] [place], [place] [address]    Special Requests   Final    BOTTLES DRAWN AEROBIC AND ANAEROBIC Blood Culture adequate volume Performed at [facility], [address] [place], [place] [address]    Culture  Setup Time   Final    Organism ID to follow GRAM NEGATIVE RODS GRAM POSITIVE COCCI IN BOTH AEROBIC AND ANAEROBIC BOTTLES CRITICAL RESULT CALLED TO, READ BACK BY AND VERIFIED WITH: NATHAN BELUE AT 0223 02/06/21 MF.    Culture GRAM NEGATIVE RODS GRAM POSITIVE COCCI  Final   Report Status PENDING  Incomplete  Culture, blood (routine x 2)     Status: None (Preliminary result)   Collection Time: 02/05/21  3:57 PM   Specimen: BLOOD  Result Value Ref Range Status   Specimen Description   Final    BLOOD RIGHT ANTECUBITAL Performed at [facility], [address] [place], [place] [address]    Special  Requests   Final    BOTTLES DRAWN AEROBIC AND ANAEROBIC Blood Culture results may not be optimal due to an excessive volume of blood received in culture bottles Performed at [facility], [address], [place], [place] [address]    Culture  Setup Time   Final    GRAM NEGATIVE RODS GRAM POSITIVE COCCI IN BOTH AEROBIC AND ANAEROBIC BOTTLES CRITICAL RESULT CALLED TO, READ BACK BY AND VERIFIED WITH: NATHAN BELUE AT 0223 02/06/21 MF.    Culture GRAM NEGATIVE RODS GRAM POSITIVE COCCI  Final   Report Status PENDING  Incomplete  Blood Culture ID Panel (Reflexed)     Status: Abnormal   Collection Time: 02/05/21  3:57 PM  Result Value Ref Range Status   Enterococcus faecalis NOT DETECTED NOT DETECTED Final   Enterococcus Faecium NOT DETECTED NOT DETECTED Final   Listeria monocytogenes NOT DETECTED NOT DETECTED Final   Staphylococcus species DETECTED (A) NOT DETECTED Final    Comment: CRITICAL RESULT CALLED TO, READ BACK BY AND VERIFIED WITH: NATHAN BELUE AT 0223 02/06/21 MF.    Staphylococcus aureus (BCID) DETECTED (A) NOT DETECTED Corrected    Comment: Methicillin (oxacillin)-resistant Staphylococcus aureus (MRSA). MRSA is predictably resistant to beta-lactam antibiotics (except ceftaroline). Preferred therapy is vancomycin unless clinically contraindicated. Patient requires contact precautions if  hospitalized. CRITICAL RESULT CALLED TO, READ BACK BY AND VERIFIED WITH: NATHAN BELUE AT 0223 02/06/21 MF. CORRECTED ON 02/14 AT 0755: PREVIOUSLY REPORTED AS DETECTED Methicillin (oxacillin) resistant Staphylococcus aureus (MRSA). MRSA is predictably resistant to beta lactam antibiotics (except ceftaroline). Preferred therapy is vancomycin unless clinically  contraindicated. Patient requires contact precautions if hospitalized. RBV NATHAN BELUE AT 0223 02/06/21 MF.    Staphylococcus epidermidis NOT DETECTED NOT DETECTED Final   Staphylococcus lugdunensis NOT DETECTED NOT DETECTED Final    Streptococcus species NOT DETECTED NOT DETECTED Final   Streptococcus agalactiae NOT DETECTED NOT DETECTED Final   Streptococcus pneumoniae NOT DETECTED NOT DETECTED Final   Streptococcus pyogenes NOT DETECTED NOT DETECTED Final   A.calcoaceticus-baumannii NOT DETECTED NOT DETECTED Final   Bacteroides fragilis NOT DETECTED NOT DETECTED Final   Enterobacterales DETECTED (A) NOT DETECTED Final    Comment: Enterobacterales represent a large order of gram negative bacteria, not a single organism. CRITICAL RESULT CALLED TO, READ BACK BY AND VERIFIED WITH: NATHAN BELUE AT 3646 02/06/21 MF    Enterobacter cloacae complex NOT DETECTED NOT DETECTED Final   Escherichia coli DETECTED (  A) NOT DETECTED Final    Comment: CRITICAL RESULT CALLED TO, READ BACK BY AND VERIFIED WITH: NATHAN BELUE AT 222/02/06/22 MF.    Klebsiella aerogenes NOT DETECTED NOT DETECTED Final   Klebsiella oxytoca NOT DETECTED NOT DETECTED Final   Klebsiella pneumoniae NOT DETECTED NOT DETECTED Final   Proteus species NOT DETECTED NOT DETECTED Final   Salmonella species NOT DETECTED NOT DETECTED Final   Serratia marcescens NOT DETECTED NOT DETECTED Final   Haemophilus influenzae NOT DETECTED NOT DETECTED Final   Neisseria meningitidis NOT DETECTED NOT DETECTED Final   Pseudomonas aeruginosa NOT DETECTED NOT DETECTED Final   Stenotrophomonas maltophilia NOT DETECTED NOT DETECTED Final   Candida albicans NOT DETECTED NOT DETECTED Final   Candida auris NOT DETECTED NOT DETECTED Final   Candida glabrata NOT DETECTED NOT DETECTED Final   Candida krusei NOT DETECTED NOT DETECTED Final   Candida parapsilosis NOT DETECTED NOT DETECTED Final   Candida tropicalis NOT DETECTED NOT DETECTED Final   Cryptococcus neoformans/gattii NOT DETECTED NOT DETECTED Final   CTX-M ESBL NOT DETECTED NOT DETECTED Final   Carbapenem resistance IMP NOT DETECTED NOT DETECTED Final   Carbapenem resistance KPC NOT DETECTED NOT DETECTED Final   Meth  resistant mecA/C and MREJ DETECTED (A) NOT DETECTED Final    Comment: CRITICAL RESULT CALLED TO, READ BACK BY AND VERIFIED WITH: NATHAN BELUE AT 222/02/06/22 MF.    Carbapenem resistance NDM NOT DETECTED NOT DETECTED Final   Carbapenem resist OXA 48 LIKE NOT DETECTED NOT DETECTED Final   Carbapenem resistance VIM NOT DETECTED NOT DETECTED Final    Comment: Performed at [facility], [place], [place] [address]  SARS CORONAVIRUS 2 (TAT 6-24 HRS) Nasopharyngeal Nasopharyngeal Swab     Status: None   Collection Time: 02/05/21  5:01 PM   Specimen: Nasopharyngeal Swab  Result Value Ref Range Status   SARS Coronavirus 2 NEGATIVE NEGATIVE Final    Comment: (NOTE) SARS-CoV-2 target nucleic acids are NOT DETECTED.  The SARS-CoV-2 RNA is generally detectable in upper and lower respiratory specimens during the acute phase of infection. Negative results do not preclude SARS-CoV-2 infection, do not rule out co-infections with other pathogens, and should not be used as the sole basis for treatment or other patient management decisions. Negative results must be combined with clinical observations, patient history, and epidemiological information. The expected result is Negative.  Fact Sheet for Patients: SugarRoll.be  Fact Sheet for Healthcare Providers: https://www.woods-mathews.com/  This test is not yet approved or cleared by the [place] FDA and  has been authorized for detection and/or diagnosis of SARS-CoV-2 by FDA under an Emergency Use Authorization (EUA). This EUA will remain  in effect (meaning this test can be used) for the duration of the COVID-19 declaration under Se ction 564(b)(1) of the Act, 21 U.S.C. section 360bbb-3(b)(1), unless the authorization is terminated or revoked sooner.  Performed at [facility], [place] [address] [place], [place] [address]          Radiology Studies: DG Chest Port  1 View  Result Date: 02/05/2021 CLINICAL DATA:  Sepsis. EXAM: PORTABLE CHEST 1 VIEW COMPARISON:  01/27/2021 and older studies. FINDINGS: Cardiac silhouette is normal in size. No mediastinal or hilar masses. Prominent bronchovascular and interstitial lung markings, stable. No evidence of pneumonia or pulmonary edema. No pleural effusion or pneumothorax. Skeletal structures are grossly intact. IMPRESSION: No acute cardiopulmonary disease. Electronically Signed   By: Lajean Manes M.D.   On: 02/05/2021 18:17  CT Renal Stone Study  Result Date: 02/05/2021 CLINICAL DATA:  Hematuria. EXAM: CT ABDOMEN AND PELVIS WITHOUT CONTRAST TECHNIQUE: Multidetector CT imaging of the abdomen and pelvis was performed following the standard protocol without IV contrast. COMPARISON:  None. FINDINGS: Lower chest: Trace bilateral pleural effusions. Mild increased subpleural reticulation at the lung bases. Minimal dependent subsegmental atelectasis in both lower lobes. Hepatobiliary: No focal liver abnormality is seen. No gallstones, gallbladder wall thickening, or biliary dilatation. Pancreas: Unremarkable. No pancreatic ductal dilatation or surrounding inflammatory changes. Spleen: Normal in size without focal abnormality. Adrenals/Urinary Tract: The adrenal glands are unremarkable. Multiple bilateral renal simple cysts measuring up to 8.9 cm on the right and 9.3 cm on the left. 4.9 cm right and 4.0 cm left minimally complicated cysts with thin peripheral calcification (series 2, image 30). 1.4 cm high density lesion in the left kidney, likely a hemorrhagic or proteinaceous cyst. Moderate right hydroureteronephrosis with several small foci of air within the collecting system. No left hydronephrosis. No renal calculi. Small amount of layering hyperdensity in the posterior bladder. Layering air within the anterior bladder related to recent instrumentation. There is also scattered air within the bladder wall, which is not significantly  thickened. Stomach/Bowel: Stomach is within normal limits. Appendix appears normal. No evidence of bowel wall thickening, distention, or inflammatory changes. Severe sigmoid colonic diverticulosis. Vascular/Lymphatic: Aortic atherosclerosis. Infrarenal abdominal aortic aneurysm measuring up to 6.2 cm. 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 No enlarged abdominal or pelvic lymph nodes. Reproductive: Mild prostatomegaly with median lobe hypertrophy indenting the bladder base. Other: Prior right inguinal hernia repair. No free fluid her pneumoperitoneum. Mild presacral stranding. Musculoskeletal: No acute or significant osseous findings. Chronic L2 superior endplate compression fracture. IMPRESSION: 1. Small amount of layering hemorrhage in the posterior bladder. Scattered air within the bladder wall, suggestive of emphysematous cystitis. 2. Moderate right hydroureteronephrosis without evident obstructing lesion or calculi. Small foci of air within the right renal collecting system, favor reflux from emphysematous cystitis versus less likely emphysematous pyelitis given degree of air. 3. Infrarenal abdominal aortic aneurysm measuring up to 6.2 cm. Recommend referral to a vascular specialist. This recommendation follows ACR consensus guidelines: White Paper of the ACR Incidental Findings Committee II on Vascular Findings. J Am Coll Radiol 2013; 82:500-370. 4. Aortic Atherosclerosis (ICD10-I70.0). Electronically Signed   By: Titus Dubin M.D.   On: 02/05/2021 15:56        Scheduled Meds: . amLODipine  10 mg Oral Daily  . atorvastatin  40 mg Oral QPM  . finasteride  5 mg Oral Daily  . hydrALAZINE  25 mg Oral Q8H  . lisinopril  5 mg Oral Daily  . sertraline  75 mg Oral Daily  . tamsulosin  0.4 mg Oral QHS  . traZODone  50 mg Oral QHS   Continuous Infusions: . cefTRIAXone (ROCEPHIN)  IV Stopped (02/06/21 0535)  . lactated ringers 125 mL/hr at 02/05/21  1845  . levETIRAcetam Stopped (02/06/21 0705)     LOS: 0 days    Time spent: 35 minutes    Sharen Hones, MD [facility]   To contact the attending provider between 7A-7P or the covering provider during after hours 7P-7A, please log into the web site [email or web address] and access using universal [place] password for that web site. If you do not have the password, please call the hospital operator.  02/06/2021, 9:25 AM

## 2021-02-06 NOTE — Progress Notes (Addendum)
[facility] Liaison note:  New referral for [facility] hospice home received from Palliative NP [employer]. TOC Stephanie Bowen aware. Patient information sent to referral. Hospice home eligibility has been confirmed.  Writer met in the room with patient's wife Geni Bers and son Shanon Brow to initiate education regarding hospice services, philosophy, team approach to care and current visitation policy. Questions answered, and understanding voiced. Hospice information and contact numbers given to [place]. [facility] is un able to offer a bed today, family and hopsital care team updated. Hospital Liaison will follow and update all regarding bed availability. Thank you for the opportunity to be involved in the care of this patient and his family. Flo Shanks BSN, RN, [facility] [employer] [phone]

## 2021-02-06 NOTE — Consult Note (Signed)
Consultation Note Date: 02/06/2021   Patient Name: Andrew Bird  DOB: 03/31/1928  MRN: [id number]  Age / Sex: 87 y.o., male  PCP: Valera Castle, MD Referring Physician: Sharen Hones, MD  Reason for Consultation: Establishing goals of care Lawyer Washabaugh Halfordis a 87 y.o.malewith medical history significant forhypertension, hyperlipidemia, BPH, subdural hematoma, requiring bur hole in November 2016, dementia, presented to the emergency department for chief concerns of bleeding from urinary catheter.  HPI/Patient Profile:  Andrew Bird is a 87 y.o. male with medical history significant for hypertension, hyperlipidemia, BPH, subdural hematoma, requiring bur hole in November 2016, dementia, presented to the emergency department for chief concerns of bleeding from urinary catheter.  Clinical Assessment and Goals of Care: Patient is resting in bed, mittens in place. He looks very frail. He is reaching with both hands toward the ceiling. He does not respond. Wife is at bedside.   She states over the weekend since his discharge, he has fallen. She states Saturday was a good day for him as he was able to spend time with his family. She articulates his current status and states she would like to focus on his comfort and dignity for what time he has left. She states he would not want to live in his current state, or his state over the past months. She would like for him to go to hospice facility "on [address] She requests I speak with son Andrew Bird and called him on her phone.   Spoke with Andrew Bird. Discussed patient's status. He confirms his father would not be happy with his QOL in the best scenario moving forward. He agrees with his mother's wishes for hospice facility placement. Discussed comfort care and visitation policy.   Working on getting him into private room as he is shifting to comfort care and is in  a semi-private room. TOC order placed for [facility] hospice.     I completed a MOST form today with wife after speaking with her and her son. The signed original was scanned and emailed for [employer]. The original and a copy was placed in the chart. A photocopy was placed in the chart to be scanned into EMR. The patient outlined their wishes for the following treatment decisions:  Cardiopulmonary Resuscitation: Do Not Attempt Resuscitation (DNR/No CPR)  Medical Interventions: Comfort Measures: Keep clean, warm, and dry. Use medication by any route, positioning, wound care, and other measures to relieve pain and suffering. Use oxygen, suction and manual treatment of airway obstruction as needed for comfort. Do not transfer to the hospital unless comfort needs cannot be met in current location.  Antibiotics: No antibiotics (use other measures to relieve symptoms)  IV Fluids: No IV fluids (provide other measures to ensure comfort)  Feeding Tube: No feeding tube      SUMMARY OF RECOMMENDATIONS   Full comfort care. Transfer to hospice facility.   Prognosis:   < 2 weeks      Primary Diagnoses: Present on Admission: . Severe sepsis with septic shock ([place]) .  Acute metabolic encephalopathy . Acute renal failure superimposed on stage 3b chronic kidney disease ([place]) . BPH (benign prostatic hyperplasia) . Hypertension . Protein-calorie malnutrition, severe . Urinary retention . Aneurysm of infrarenal abdominal aorta ([facility]) . Septic shock ([place])   I have reviewed the medical record, interviewed the patient and family, and examined the patient. The following aspects are pertinent.  Past Medical History:  Diagnosis Date  . BPH (benign prostatic hyperplasia)   . Bradycardia   . Coronary artery disease   . Enlarged prostate   . Hypertension   . Renal disorder   . Senile dementia [facility])    Social History   Socioeconomic History  . Marital status: Married    Spouse name: Not on file   . Number of children: Not on file  . Years of education: Not on file  . Highest education level: Not on file  Occupational History  . Not on file  Tobacco Use  . Smoking status: Former Research scientist (life sciences)  . Smokeless tobacco: Never Used  Substance and Sexual Activity  . Alcohol use: No  . Drug use: Not on file  . Sexual activity: Not on file  Other Topics Concern  . Not on file  Social History Narrative  . Not on file   Social Determinants of Health   Financial Resource Strain: Not on file  Food Insecurity: Not on file  Transportation Needs: Not on file  Physical Activity: Not on file  Stress: Not on file  Social Connections: Not on file   History reviewed. No pertinent family history. Scheduled Meds: . amLODipine  10 mg Oral Daily  . atorvastatin  40 mg Oral QPM  . [START ON 02/07/2021] Chlorhexidine Gluconate Cloth  6 each Topical Q0600  . finasteride  5 mg Oral Daily  . hydrALAZINE  25 mg Oral Q8H  . lisinopril  5 mg Oral Daily  . sertraline  75 mg Oral Daily  . tamsulosin  0.4 mg Oral QHS  . traZODone  50 mg Oral QHS  . vancomycin variable dose per unstable renal function (pharmacist dosing)   Does not apply See admin instructions   Continuous Infusions: . cefTRIAXone (ROCEPHIN)  IV Stopped (02/06/21 0535)  . lactated ringers 125 mL/hr at 02/05/21 1845  . levETIRAcetam Stopped (02/06/21 0705)   PRN Meds:.acetaminophen **OR** acetaminophen, ondansetron **OR** ondansetron (ZOFRAN) IV Medications Prior to Admission:  Prior to Admission medications   Medication Sig Start Date End Date Taking? Authorizing Provider  amLODipine (NORVASC) 10 MG tablet Take 1 tablet (10 mg total) by mouth daily. 02/03/21  Yes Sreenath, Sudheer B, MD  atorvastatin (LIPITOR) 40 MG tablet Take 40 mg by mouth daily.   Yes [provider]  cholecalciferol (VITAMIN D) 400 UNITS TABS tablet Take 2,000 Units by mouth daily.   Yes [provider]  clobetasol cream (TEMOVATE) 1.22 % Apply 1  application topically 2 (two) times daily.   Yes [provider]  famotidine (PEPCID) 20 MG tablet Take 20 mg by mouth 2 (two) times daily. 01/07/21  Yes [provider]  finasteride (PROSCAR) 5 MG tablet Take 5 mg by mouth daily.   Yes [provider]  hydrALAZINE (APRESOLINE) 25 MG tablet Take 1 tablet (25 mg total) by mouth every 8 (eight) hours. 02/02/21  Yes Sreenath, Sudheer B, MD  levETIRAcetam (KEPPRA) 500 MG tablet Take 500 mg by mouth 2 (two) times daily. 12/09/16  Yes [provider]  lisinopril (ZESTRIL) 5 MG tablet Take 5 mg by mouth  daily. 01/17/21  Yes [provider]  ranitidine (ZANTAC) 150 MG tablet Take 150 mg by mouth 2 (two) times daily.   Yes [provider]  sertraline (ZOLOFT) 50 MG tablet Take 75 mg by mouth daily. 07/21/20 07/21/21 Yes [provider]  tamsulosin (FLOMAX) 0.4 MG CAPS capsule Take 0.4 mg by mouth daily.   Yes [provider]  timolol (TIMOPTIC) 0.5 % ophthalmic solution Place 1 drop into both eyes daily.   Yes [provider]  traZODone (DESYREL) 50 MG tablet Take 50 mg by mouth at bedtime.   Yes [provider]  mometasone (ELOCON) 0.1 % ointment  03/04/20   [provider]   Allergies  Allergen Reactions  . Aspirin Other (See Comments)    Other reaction(s): Blood Disorder Intracranial bleed Intracranial bleed    Review of Systems  Unable to perform ROS   Physical Exam Constitutional:      Comments: Eyes closed.   Skin:    General: Skin is warm and dry.     Vital Signs: BP (!) 180/136   Pulse 65   Temp 98.4 F (36.9 C)   Resp 17   Ht 6' (1.829 m)   Wt 74.9 kg   SpO2 99%   BMI 22.39 kg/m  Pain Scale: PAINAD       SpO2: SpO2: 99 % O2 Device:SpO2: 99 % O2 Flow Rate: .   IO: Intake/output summary:   Intake/Output Summary (Last 24 hours) at 02/06/2021 1311 Last data filed at 02/06/2021 8350 Gross per 24 hour  Intake 1400 ml  Output  --  Net 1400 ml    LBM:   Baseline Weight: Weight: 74.9 kg Most recent weight: Weight: 74.9 kg      Time In: 12:35 Time Out: 1:10 Time Total: 35 min Greater than 50%  of this time was spent counseling and coordinating care related to the above assessment and plan.  Signed by: Asencion Gowda, NP   Please contact Palliative Medicine Team phone at [phone] for questions and concerns.  For individual provider: See Shea Evans

## 2021-02-06 NOTE — Progress Notes (Signed)
Urology Inpatient Progress Note  Subjective: Foley catheter in place draining clear, pink urine without clots. Morning labs unavailable. Urine culture pending, blood cultures positive for MRSA and E. coli, on antibiotics as below Patient is sleeping this morning and unable to contribute to HPI.  Anti-infectives: Anti-infectives (From admission, onward)   Start     Dose/Rate Route Frequency Ordered Stop   02/06/21 0400  ceFEPIme (MAXIPIME) 2 g in sodium chloride 0.9 % 100 mL IVPB  Status:  Discontinued        2 g 200 mL/hr over 30 Minutes Intravenous Every 24 hours 02/05/21 1800 02/06/21 0309   02/06/21 0400  cefTRIAXone (ROCEPHIN) 2 g in sodium chloride 0.9 % 100 mL IVPB        2 g 200 mL/hr over 30 Minutes Intravenous Every 24 hours 02/06/21 0309     02/05/21 1758  vancomycin variable dose per unstable renal function (pharmacist dosing)         Does not apply See admin instructions 02/05/21 1800     02/05/21 1730  ceFEPIme (MAXIPIME) 2 g in sodium chloride 0.9 % 100 mL IVPB  Status:  Discontinued        2 g 200 mL/hr over 30 Minutes Intravenous  Once 02/05/21 1724 02/05/21 1759   02/05/21 1730  metroNIDAZOLE (FLAGYL) IVPB 500 mg  Status:  Discontinued        500 mg 100 mL/hr over 60 Minutes Intravenous Every 8 hours 02/05/21 1724 02/06/21 0309   02/05/21 1730  vancomycin (VANCOCIN) IVPB 1000 mg/200 mL premix  Status:  Discontinued        1,000 mg 200 mL/hr over 60 Minutes Intravenous  Once 02/05/21 1724 02/05/21 1759   02/05/21 1545  vancomycin (VANCOREADY) IVPB 1500 mg/300 mL        1,500 mg 150 mL/hr over 120 Minutes Intravenous  Once 02/05/21 1521 02/05/21 1858   02/05/21 1530  meropenem (MERREM) 1 g in sodium chloride 0.9 % 100 mL IVPB  Status:  Discontinued        1 g 200 mL/hr over 30 Minutes Intravenous Every 12 hours 02/05/21 1524 02/05/21 1800      Current Facility-Administered Medications  Medication Dose Route Frequency Provider Last Rate Last Admin  . acetaminophen  (TYLENOL) tablet 325 mg  325 mg Oral Q6H PRN Cox, Amy N, DO       Or  . acetaminophen (TYLENOL) suppository 325 mg  325 mg Rectal Q6H PRN Cox, Amy N, DO      . amLODipine (NORVASC) tablet 10 mg  10 mg Oral Daily Cox, Amy N, DO      . atorvastatin (LIPITOR) tablet 40 mg  40 mg Oral QPM Cox, Amy N, DO      . cefTRIAXone (ROCEPHIN) 2 g in sodium chloride 0.9 % 100 mL IVPB  2 g Intravenous Q24H Manuela Schwartz, NP   Stopped at 02/06/21 0535  . finasteride (PROSCAR) tablet 5 mg  5 mg Oral Daily Cox, Amy N, DO      . hydrALAZINE (APRESOLINE) tablet 25 mg  25 mg Oral Q8H Cox, Amy N, DO      . lactated ringers infusion   Intravenous Continuous Cox, Amy N, DO 125 mL/hr at 02/05/21 1845 New Bag at 02/05/21 1845  . levETIRAcetam (KEPPRA) IVPB 500 mg/100 mL premix  500 mg Intravenous Q12H Cox, Amy N, DO   Stopped at 02/06/21 0705  . lisinopril (ZESTRIL) tablet 5 mg  5 mg Oral Daily Cox, Amy  N, DO      . ondansetron (ZOFRAN) tablet 4 mg  4 mg Oral Q6H PRN Cox, Amy N, DO       Or  . ondansetron (ZOFRAN) injection 4 mg  4 mg Intravenous Q6H PRN Cox, Amy N, DO      . sertraline (ZOLOFT) tablet 75 mg  75 mg Oral Daily Cox, Amy N, DO      . tamsulosin (FLOMAX) capsule 0.4 mg  0.4 mg Oral QHS Cox, Amy N, DO      . traZODone (DESYREL) tablet 50 mg  50 mg Oral QHS Cox, Amy N, DO      . vancomycin variable dose per unstable renal function (pharmacist dosing)   Does not apply See admin instructions Cox, Amy N, DO       Current Outpatient Medications  Medication Sig Dispense Refill  . amLODipine (NORVASC) 10 MG tablet Take 1 tablet (10 mg total) by mouth daily.    Marland Kitchen atorvastatin (LIPITOR) 40 MG tablet Take 40 mg by mouth daily.    . cholecalciferol (VITAMIN D) 400 UNITS TABS tablet Take 2,000 Units by mouth daily.    . clobetasol cream (TEMOVATE) 0.05 % Apply 1 application topically 2 (two) times daily.    . famotidine (PEPCID) 20 MG tablet Take 20 mg by mouth 2 (two) times daily.    . finasteride (PROSCAR) 5 MG  tablet Take 5 mg by mouth daily.    . hydrALAZINE (APRESOLINE) 25 MG tablet Take 1 tablet (25 mg total) by mouth every 8 (eight) hours.    . levETIRAcetam (KEPPRA) 500 MG tablet Take 500 mg by mouth 2 (two) times daily.    Marland Kitchen lisinopril (ZESTRIL) 5 MG tablet Take 5 mg by mouth daily.    . ranitidine (ZANTAC) 150 MG tablet Take 150 mg by mouth 2 (two) times daily.    . sertraline (ZOLOFT) 50 MG tablet Take 75 mg by mouth daily.    . tamsulosin (FLOMAX) 0.4 MG CAPS capsule Take 0.4 mg by mouth daily.    . timolol (TIMOPTIC) 0.5 % ophthalmic solution Place 1 drop into both eyes daily.    . traZODone (DESYREL) 50 MG tablet Take 50 mg by mouth at bedtime.    . mometasone (ELOCON) 0.1 % ointment  (Patient not taking: No sig reported)     Objective: Vital signs in last 24 hours: Temp:  [99 F (37.2 C)-100.2 F (37.9 C)] 100.2 F (37.9 C) (02/13 2159) Pulse Rate:  [74-118] 74 (02/14 0827) Resp:  [20-25] 22 (02/14 0827) BP: (88-126)/(57-84) 106/69 (02/14 0827) SpO2:  [93 %-98 %] 98 % (02/14 0827) Weight:  [74.9 kg] 74.9 kg (02/13 1250)  Intake/Output from previous day: 02/13 0701 - 02/14 0700 In: 1300 [IV Piggyback:1300] Out: -  Intake/Output this shift: Total I/O In: 100 [IV Piggyback:100] Out: -   Physical Exam Vitals and nursing note reviewed.  Constitutional:      General: He is sleeping.  Pulmonary:     Effort: Pulmonary effort is normal. No respiratory distress.  Skin:    General: Skin is warm and dry.    Lab Results:  Recent Labs    02/05/21 1433  WBC 19.2*  HGB 11.8*  HCT 34.6*  PLT 183   BMET Recent Labs    02/05/21 1252  NA 132*  K 4.4  CL 103  CO2 16*  GLUCOSE 119*  BUN 54*  CREATININE 2.15*  CALCIUM 8.6*   PT/INR No results for input(s): LABPROT,  INR in the last 72 hours. ABG Recent Labs    02/05/21 1500  HCO3 19.7*    Studies/Results: DG Chest Port 1 View  Result Date: 02/05/2021 CLINICAL DATA:  Sepsis. EXAM: PORTABLE CHEST 1 VIEW  COMPARISON:  01/27/2021 and older studies. FINDINGS: Cardiac silhouette is normal in size. No mediastinal or hilar masses. Prominent bronchovascular and interstitial lung markings, stable. No evidence of pneumonia or pulmonary edema. No pleural effusion or pneumothorax. Skeletal structures are grossly intact. IMPRESSION: No acute cardiopulmonary disease. Electronically Signed   By: Amie Portlandavid  Ormond M.D.   On: 02/05/2021 18:17   CT Renal Stone Study  Result Date: 02/05/2021 CLINICAL DATA:  Hematuria. EXAM: CT ABDOMEN AND PELVIS WITHOUT CONTRAST TECHNIQUE: Multidetector CT imaging of the abdomen and pelvis was performed following the standard protocol without IV contrast. COMPARISON:  None. FINDINGS: Lower chest: Trace bilateral pleural effusions. Mild increased subpleural reticulation at the lung bases. Minimal dependent subsegmental atelectasis in both lower lobes. Hepatobiliary: No focal liver abnormality is seen. No gallstones, gallbladder wall thickening, or biliary dilatation. Pancreas: Unremarkable. No pancreatic ductal dilatation or surrounding inflammatory changes. Spleen: Normal in size without focal abnormality. Adrenals/Urinary Tract: The adrenal glands are unremarkable. Multiple bilateral renal simple cysts measuring up to 8.9 cm on the right and 9.3 cm on the left. 4.9 cm right and 4.0 cm left minimally complicated cysts with thin peripheral calcification (series 2, image 30). 1.4 cm high density lesion in the left kidney, likely a hemorrhagic or proteinaceous cyst. Moderate right hydroureteronephrosis with several small foci of air within the collecting system. No left hydronephrosis. No renal calculi. Small amount of layering hyperdensity in the posterior bladder. Layering air within the anterior bladder related to recent instrumentation. There is also scattered air within the bladder wall, which is not significantly thickened. Stomach/Bowel: Stomach is within normal limits. Appendix appears normal.  No evidence of bowel wall thickening, distention, or inflammatory changes. Severe sigmoid colonic diverticulosis. Vascular/Lymphatic: Aortic atherosclerosis. Infrarenal abdominal aortic aneurysm measuring up to 6.2 cm. 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 No enlarged abdominal or pelvic lymph nodes. Reproductive: Mild prostatomegaly with median lobe hypertrophy indenting the bladder base. Other: Prior right inguinal hernia repair. No free fluid her pneumoperitoneum. Mild presacral stranding. Musculoskeletal: No acute or significant osseous findings. Chronic L2 superior endplate compression fracture. IMPRESSION: 1. Small amount of layering hemorrhage in the posterior bladder. Scattered air within the bladder wall, suggestive of emphysematous cystitis. 2. Moderate right hydroureteronephrosis without evident obstructing lesion or calculi. Small foci of air within the right renal collecting system, favor reflux from emphysematous cystitis versus less likely emphysematous pyelitis given degree of air. 3. Infrarenal abdominal aortic aneurysm measuring up to 6.2 cm. Recommend referral to a vascular specialist. This recommendation follows ACR consensus guidelines: White Paper of the ACR Incidental Findings Committee II on Vascular Findings. J Am Coll Radiol 2013; 40:981-191; 10:789-794. 4. Aortic Atherosclerosis (ICD10-I70.0). Electronically Signed   By: Obie DredgeWilliam T Derry M.D.   On: 02/05/2021 15:56   Assessment & Plan: 87 year old male admitted with sepsis of unknown source s/p traumatic Foley removal causing gross hematuria with Foley catheter replaced in the ED.  Foley catheter draining well this morning with no evidence of clots in the tubing.  Recommendations: -Hand irrigate Foley catheter if it stops draining well or starts passing clots -Continue Flomax and finasteride -Agree with empiric antibiotics as E. coli bacteremia may be of urinary source, to be determined per  urine culture result -Outpatient  follow-up with his primary urologist or Dr. Lonna Cobb per patient and family preference  Carman Ching, PA-C 02/06/2021

## 2021-02-06 NOTE — Progress Notes (Signed)
PHARMACY - PHYSICIAN COMMUNICATION CRITICAL VALUE ALERT - BLOOD CULTURE IDENTIFICATION (BCID)  4 of 4 bottle with both MRSA and E. Coli.  Pt currently on Vanc, Cefepime, and Flagyl.  Pharmacy renally dosing Vanc and Cefepime.  Name of physician (or Provider) ContactedCliffton Asters, NP  Changes to prescribed antibiotics required: Continue Vanc, switch Cefepime to Rocephin 2gm daily and DC Flaygl  Otelia Sergeant, PharmD, [facility] 02/06/2021 3:07 AM

## 2021-02-07 DIAGNOSIS — R6521 Severe sepsis with septic shock: Secondary | ICD-10-CM | POA: Diagnosis not present

## 2021-02-07 DIAGNOSIS — N178 Other acute kidney failure: Secondary | ICD-10-CM | POA: Diagnosis not present

## 2021-02-07 DIAGNOSIS — A419 Sepsis, unspecified organism: Secondary | ICD-10-CM | POA: Diagnosis not present

## 2021-02-07 DIAGNOSIS — Z515 Encounter for palliative care: Secondary | ICD-10-CM | POA: Diagnosis not present

## 2021-02-07 DIAGNOSIS — N390 Urinary tract infection, site not specified: Secondary | ICD-10-CM | POA: Diagnosis not present

## 2021-02-07 DIAGNOSIS — R339 Retention of urine, unspecified: Secondary | ICD-10-CM | POA: Diagnosis not present

## 2021-02-07 LAB — URINE CULTURE: Culture: >=100000 — AB

## 2021-02-07 MED ORDER — ACETAMINOPHEN 650 MG RE SUPP: 650.0000 mg | Freq: Four times a day (QID) | RECTAL | 0 refills | Status: DC | PRN

## 2021-02-07 MED ORDER — ACETAMINOPHEN 325 MG PO TABS: 650.0000 mg | ORAL_TABLET | Freq: Four times a day (QID) | ORAL | Status: DC | PRN

## 2021-02-07 MED ORDER — GLYCOPYRROLATE 1 MG PO TABS: 2.0000 mg | ORAL_TABLET | ORAL | Status: DC | PRN

## 2021-02-07 MED ORDER — HALOPERIDOL 2 MG PO TABS: 2.0000 mg | ORAL_TABLET | ORAL | Status: DC | PRN

## 2021-02-07 MED ORDER — HYDROMORPHONE HCL 1 MG/ML IJ SOLN: 0.5000 mg | INTRAMUSCULAR | 0 refills | Status: DC | PRN

## 2021-02-07 MED ORDER — ONDANSETRON HCL 4 MG PO TABS: 4.0000 mg | ORAL_TABLET | Freq: Four times a day (QID) | ORAL | 0 refills | Status: DC | PRN

## 2021-02-07 MED ORDER — LORAZEPAM 1 MG PO TABS: 1.0000 mg | ORAL_TABLET | ORAL | 0 refills | Status: DC | PRN

## 2021-02-07 MED ORDER — HALOPERIDOL LACTATE 2 MG/ML PO CONC: 2.0000 mg | ORAL | 0 refills | Status: DC | PRN

## 2021-02-07 NOTE — TOC Transition Note (Signed)
Transition of Care [facility]) - CM/SW Discharge Note   Patient Details  Name: Andrew Bird MRN: [id number] Date of Birth: [birth year]  [facility] CM/SW Contact:  Chapman Fitch, RN Phone Number: 02/07/2021, 10:11 AM   Clinical Narrative:     Patient discharging to Hospice home today Family has already been to the facility and signed paper work  EMS packet and signed DNR on chart  EMS transport arranged with [facility] for 11:30   Final next level of care: Hospice Medical Facility Barriers to Discharge: No Barriers Identified   Patient Goals and CMS Choice        Discharge Placement                       Discharge Plan and Services                                     Social Determinants of Health (SDOH) Interventions     Readmission Risk Interventions No flowsheet data found.

## 2021-02-07 NOTE — Progress Notes (Signed)
[facility] Room 217 [facility] Hospital Liaison RN note:  Hospice Home is able to offer a room today. Spoke with spouse, Adela Lank, over the phone. She will be signing consents today at the Hospice Home at 10am. Transport has been arranged for 11:30. I have faxed the discharge summary.  Please call with any hospice related questions or concerns.  Thank you for the opportunity to participate in this patient's care.  Cyndra Numbers, RN [facility] Liaison  [phone]

## 2021-02-07 NOTE — Discharge Summary (Signed)
Physician Discharge Summary  Patient ID: Andrew Bird MRN: [id number] DOB/AGE: 85-Feb-1929 85 y.o.  Admit date: 02/05/2021 Discharge date: 02/07/2021  Admission Diagnoses:  Discharge Diagnoses:  Principal Problem:   Severe sepsis with septic shock [facility]) Active Problems:   Acute metabolic encephalopathy   Acute renal failure superimposed on stage 3b chronic kidney disease ([place])   Urinary retention   BPH (benign prostatic hyperplasia)   Hypertension   Protein-calorie malnutrition, severe   Aneurysm of infrarenal abdominal aorta (HCC)   Septic shock (HCC)   Gross hematuria   Acute lower UTI   Discharged Condition: Terminal  Hospital Course: Andrew Sliker Halfordis a 87 y.o.malewith medical history significant forhypertension, hyperlipidemia, BPH, subdural hematoma, requiring bur hole in November 2016, dementia, presented to the emergency department for chief concerns of bleeding from urinary catheter. Upon arriving the emergency room, he met  septic shock criteria with a lactic acid level of 5.2. He was given fluids, and broad-spectrum antibiotics. The infection was thought to be from a urinary source. He was seen by urology, the clogged Foley catheter was replaced, bladder was flushed out large quantity of blood clots.  #1. Septic shock. Secondary to urinary tract infection. MRSA septicemia. Urinary tract infection secondary to blocked urinary catheter and a large hematuria. Gross hematuria probably from trauma. Mild hydronephrosis secondary to obstruction from hematuria. Urinary retention.  #2. Acute kidney injury on chronic kidney disease stage IIIb. Hyponatremia  #3. severe dementia. Aspiration risk. Severe protein calorie malnutrition.  Patient has been seen by palliative care, accepted into inpatient hospice.  Patient currently under comfort care.  Prognosis is less than 1 week.  Consults: Hospice  Significant Diagnostic Studies:   Treatments: Abx  Discharge  Exam: Blood pressure (!) 91/58, pulse 73, temperature 98.5 F (36.9 C), temperature source Oral, resp. rate 12, height 6' (1.829 m), weight 74.9 kg, SpO2 96 %. General appearance: Unresponsive Resp: clear to auscultation bilaterally Cardio: Regular and no murmurs GI: soft, non-tender; bowel sounds normal; no masses,  no organomegaly Extremities: No edema  Disposition: Discharge disposition: 51-Hospice/Medical Facility       Discharge Instructions    Diet - low sodium heart healthy   Complete by: As directed    Increase activity slowly   Complete by: As directed      Allergies as of 02/07/2021      Reactions   Aspirin Other (See Comments)   Other reaction(s): Blood Disorder Intracranial bleed Intracranial bleed      Medication List    STOP taking these medications   amLODipine 10 MG tablet Commonly known as: NORVASC   atorvastatin 40 MG tablet Commonly known as: LIPITOR   cholecalciferol 10 MCG (400 UNIT) Tabs tablet Commonly known as: VITAMIN D3   clobetasol cream 0.05 % Commonly known as: TEMOVATE   famotidine 20 MG tablet Commonly known as: PEPCID   finasteride 5 MG tablet Commonly known as: PROSCAR   hydrALAZINE 25 MG tablet Commonly known as: APRESOLINE   lisinopril 5 MG tablet Commonly known as: ZESTRIL   mometasone 0.1 % ointment Commonly known as: ELOCON   ranitidine 150 MG tablet Commonly known as: ZANTAC   sertraline 50 MG tablet Commonly known as: ZOLOFT   tamsulosin 0.4 MG Caps capsule Commonly known as: FLOMAX   timolol 0.5 % ophthalmic solution Commonly known as: TIMOPTIC   traZODone 50 MG tablet Commonly known as: DESYREL     TAKE these medications   acetaminophen 650 MG suppository Commonly known as: TYLENOL Place 1  suppository (650 mg total) rectally every 6 (six) hours as needed for mild pain (or Fever >/= 101).   acetaminophen 325 MG tablet Commonly known as: TYLENOL Take 2 tablets (650 mg total) by mouth every 6  (six) hours as needed for mild pain (or Fever >/= 101).   glycopyrrolate 1 MG tablet Commonly known as: ROBINUL Take 2 tablets (2 mg total) by mouth every 4 (four) hours as needed (excessive secretions).   haloperidol 2 MG tablet Commonly known as: HALDOL Take 1 tablet (2 mg total) by mouth every 4 (four) hours as needed for agitation (or delirium).   haloperidol 2 MG/ML solution Commonly known as: HALDOL Place 1 mL (2 mg total) under the tongue every 4 (four) hours as needed for agitation (or delirium).   HYDROmorphone 1 MG/ML injection Commonly known as: DILAUDID Inject 0.5 mLs (0.5 mg total) into the vein every hour as needed for severe pain or moderate pain (or dyspnea.               0.87m for moderate pain or dyspnea, 0.562mfor sever pain or dyspnea.).   levETIRAcetam 500 MG tablet Commonly known as: KEPPRA Take 500 mg by mouth 2 (two) times daily.   LORazepam 1 MG tablet Commonly known as: ATIVAN Take 1 tablet (1 mg total) by mouth every 4 (four) hours as needed for anxiety.   ondansetron 4 MG tablet Commonly known as: ZOFRAN Take 1 tablet (4 mg total) by mouth every 6 (six) hours as needed for nausea.       Follow-up Information    Stoioff, ScRonda FairlyMD. Go in 1 day.   Specialty: Urology Why: as scheduled Contact information: [phone]             Signed: DeSharen Hones/15/2022, 9:07 AM

## 2021-02-07 NOTE — Progress Notes (Signed)
Daily Progress Note   Patient Name: Andrew Bird       Date: 02/07/2021 DOB: 02-15-1928  Age: 87 y.o. MRN#: [id number] Attending Physician: Marrion Coy, MD Primary Care Physician: Dione Housekeeper, MD Admit Date: 02/05/2021  Reason for Consultation/Follow-up: Terminal Care  Subjective: Patient is resting in bed with eyes closed. Patient appears comfortable, no signs of distress noted. No change to medication regimen. No family at bedside. Plans for D/C to hospice.  Length of Stay: 1  Current Medications: Scheduled Meds:  . Chlorhexidine Gluconate Cloth  6 each Topical Q0600  . finasteride  5 mg Oral Daily  . sodium chloride flush  3 mL Intravenous Q12H  . tamsulosin  0.4 mg Oral QHS  . traZODone  50 mg Oral QHS    Continuous Infusions: . levETIRAcetam 500 mg (02/07/21 0515)    PRN Meds: acetaminophen **OR** acetaminophen, acetaminophen **OR** acetaminophen, antiseptic oral rinse, glycopyrrolate **OR** glycopyrrolate, haloperidol **OR** haloperidol **OR** haloperidol lactate, HYDROmorphone (DILAUDID) injection, LORazepam **OR** LORazepam **OR** LORazepam, ondansetron **OR** ondansetron (ZOFRAN) IV, ondansetron **OR** ondansetron (ZOFRAN) IV, polyvinyl alcohol, sodium chloride flush  Physical Exam Constitutional:      Comments: Eyes closed.   Pulmonary:     Comments: Even and unlabored respirations.             Vital Signs: BP (!) 91/58 (BP Location: Left Arm)   Pulse 73   Temp 98.5 F (36.9 C) (Oral)   Resp 12   Ht 6' (1.829 m)   Wt 74.9 kg   SpO2 96%   BMI 22.39 kg/m  SpO2: SpO2: 96 % O2 Device: O2 Device: Room Air O2 Flow Rate:    Intake/output summary:   Intake/Output Summary (Last 24 hours) at 02/07/2021 1023 Last data filed at 02/07/2021 8453 Gross  per 24 hour  Intake 0 ml  Output 1900 ml  Net -1900 ml   LBM: Last BM Date: 02/06/21 Baseline Weight: Weight: 74.9 kg Most recent weight: Weight: 74.9 kg           Patient Active Problem List   Diagnosis Date Noted  . Septic shock (HCC) 02/06/2021  . Gross hematuria 02/06/2021  . Acute lower UTI 02/06/2021  . Severe sepsis with septic shock (HCC) 02/05/2021  . Aneurysm of infrarenal abdominal aorta (HCC) 02/05/2021  . Protein-calorie malnutrition,  severe 02/01/2021  . Acute renal failure superimposed on stage 3b chronic kidney disease (HCC) 01/28/2021  . Urinary retention 01/28/2021  . BPH (benign prostatic hyperplasia)   . Hypertension   . Acute metabolic encephalopathy 01/27/2021    Palliative Care Assessment & Plan    Recommendations/Plan:  Comfort care. Transfer to hospice facility.   No changes to medications.     Code Status:    Code Status Orders  (From admission, onward)         Start     Ordered   02/06/21 1348  Do not attempt resuscitation (DNR)  Continuous       Question Answer Comment  In the event of cardiac or respiratory ARREST Do not call a "code blue"   In the event of cardiac or respiratory ARREST Do not perform Intubation, CPR, defibrillation or ACLS   In the event of cardiac or respiratory ARREST Use medication by any route, position, wound care, and other measures to relive pain and suffering. May use oxygen, suction and manual treatment of airway obstruction as needed for comfort.   Comments New MOST form for comfort measures on chart.      02/06/21 1350        Code Status History    Date Active Date Inactive Code Status Order ID Comments User Context   02/05/2021 1724 02/06/2021 1350 DNR [id number]  Lovenia Kim, DO ED   02/02/2021 1423 02/02/2021 2230 DNR [id number]  Morton Stall, NP Inpatient   01/27/2021 1647 02/02/2021 1423 Full Code [id number]  Howerter, Chaney Born, DO ED   Advance Care Planning Activity    Advance Directive  Documentation   Flowsheet Row Most Recent Value  Type of Advance Directive Out of facility DNR (pink MOST or yellow form)  Pre-existing out of facility DNR order (yellow form or pink MOST form) Yellow form placed in chart (order not valid for inpatient use)  "MOST" Form in Place? --       Prognosis:  < 2 weeks.    Thank you for allowing the Palliative Medicine Team to assist in the care of this patient.   Total Time 15 min Prolonged Time Billed no       Greater than 50%  of this time was spent counseling and coordinating care related to the above assessment and plan.  Morton Stall, NP  Please contact Palliative Medicine Team phone at [phone] for questions and concerns.

## 2021-02-07 NOTE — Plan of Care (Signed)
Report called to Hospice - s/w Derwood Kaplan, RN.  Patient  will be transferred via EMS.  Lines x2 and foley left intact per Hospice request.  Will be transported via EMS to room 7.  Set to arrive about 1130.

## 2021-02-08 LAB — CULTURE, BLOOD (ROUTINE X 2): Special Requests: ADEQUATE

## 2021-02-08 LAB — BLOOD GAS, VENOUS
Acid-Base Excess: 1.6 mmol/L (ref 0.0–2.0)
Bicarbonate: 27.7 mmol/L (ref 20.0–28.0)
O2 Saturation: 52.8 %
Patient temperature: 37
pCO2, Ven: 48 mmHg (ref 44.0–60.0)
pH, Ven: 7.37 (ref 7.250–7.430)

## 2021-02-21 DEATH — deceased

## 2021-08-25 IMAGING — CT CT HEAD W/O CM
3 series · 16 of 47 positions shown, 19 images · non-contrast
Comparison: November 21, 2015.

CLINICAL DATA: Altered mental status.

EXAM:
CT HEAD WITHOUT CONTRAST
TECHNIQUE: Contiguous axial images were obtained from the base of the skull
through the vertex without intravenous contrast.

[Series 3: head wo · axial · 0.47mm/px · z∈[-105,+20]mm · 10 of 30 slices shown, 13 images]
[im 3/30  brain]
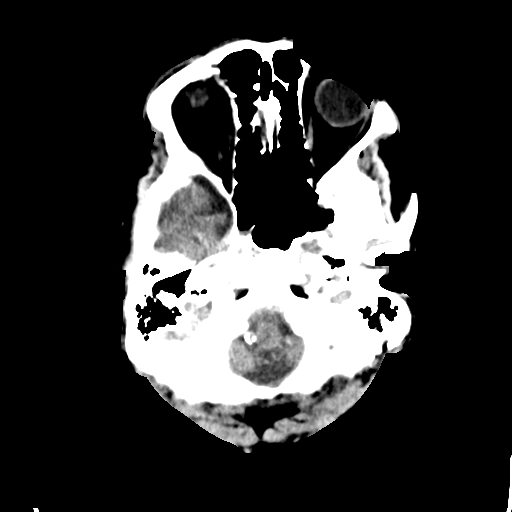
[im 3/30  bone]
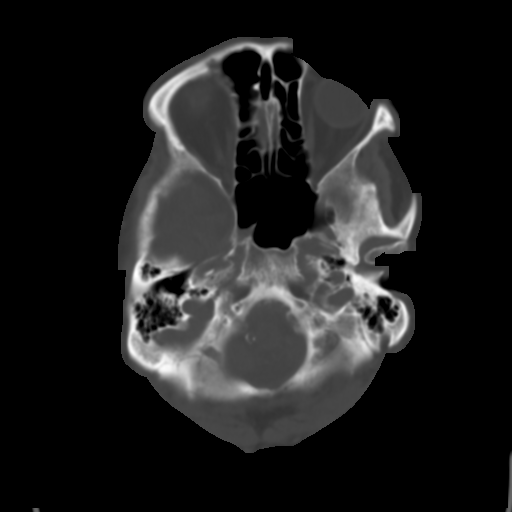
[im 6/30  brain]
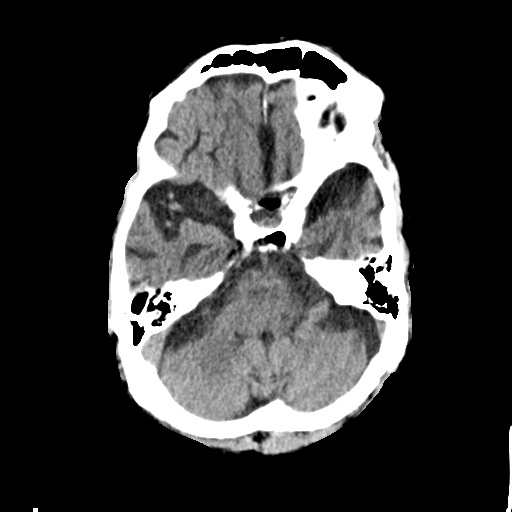
[im 9/30  brain]
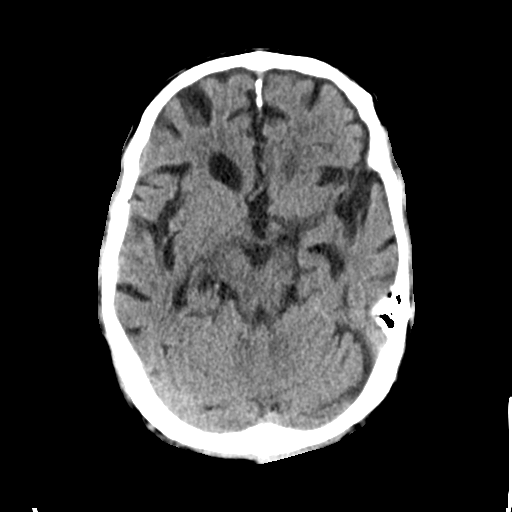
[im 11/30  brain]
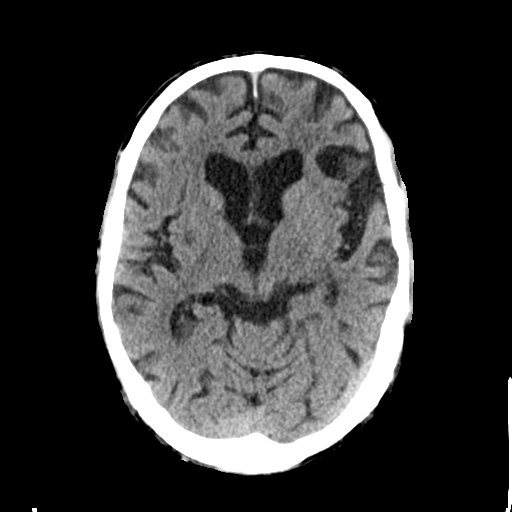
[im 14/30  brain]
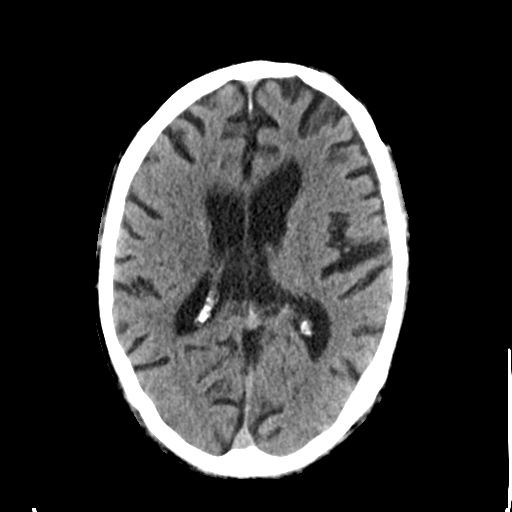
[im 14/30  bone]
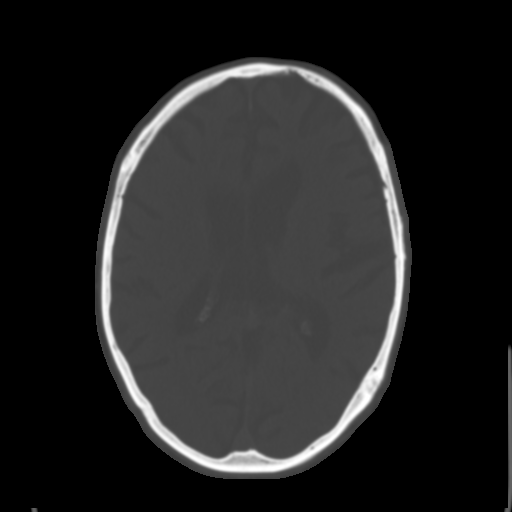
[im 17/30  brain]
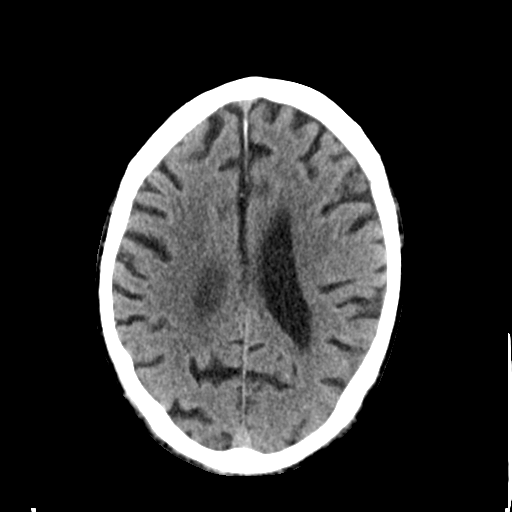
[im 20/30  brain]
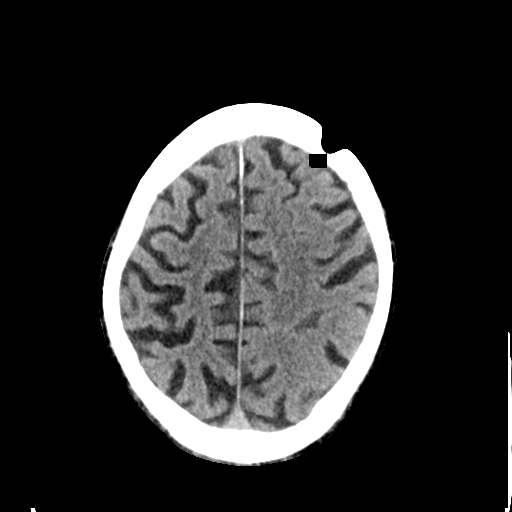
[im 23/30  brain]
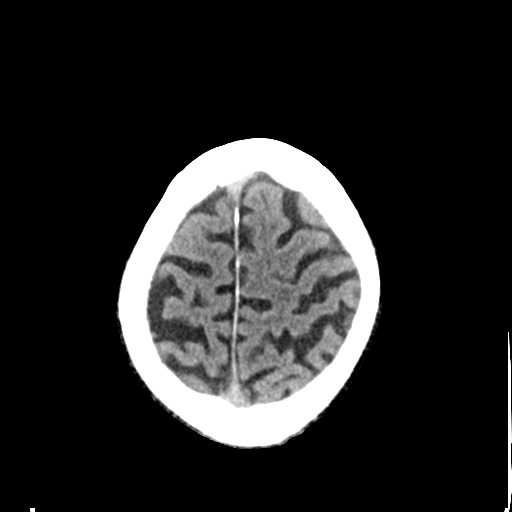
[im 25/30  brain]
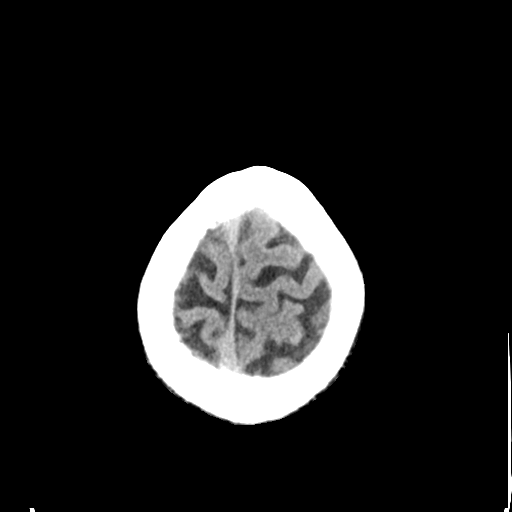
[im 25/30  bone]
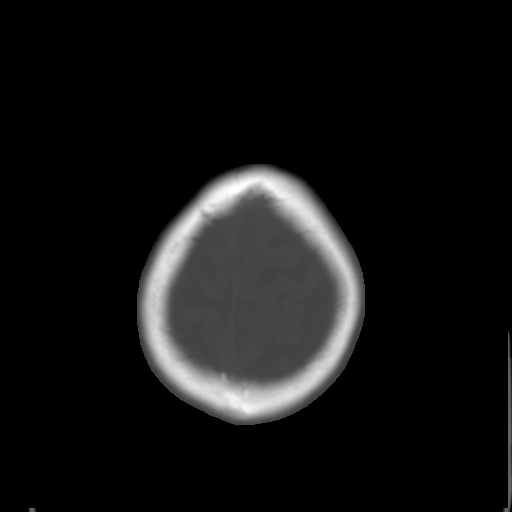
[im 28/30  brain]
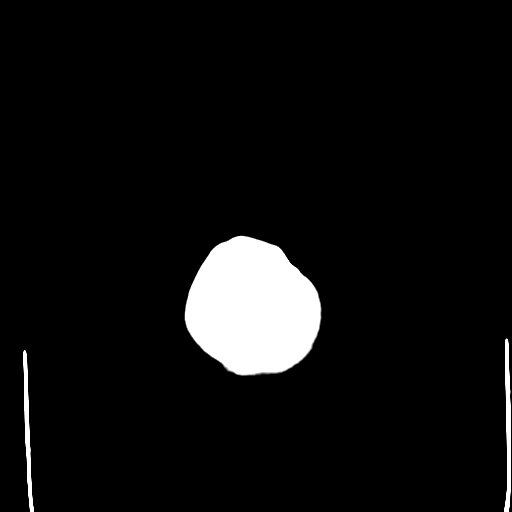

[Series 4: coronal soft tissue · coronal · 0.33mm/px · 3 of 74 slices shown]
[im 25/74  brain]
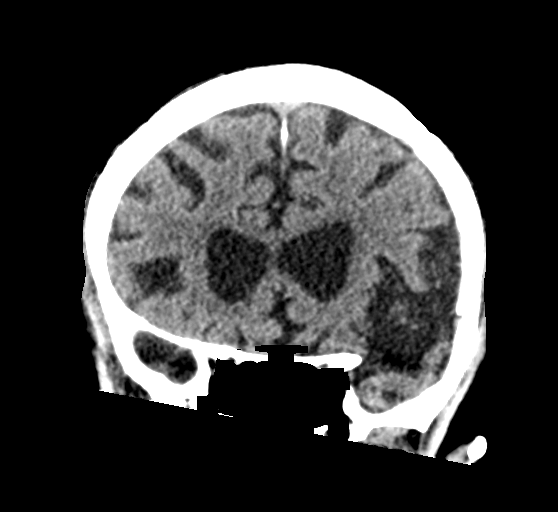
[im 33/74  brain]
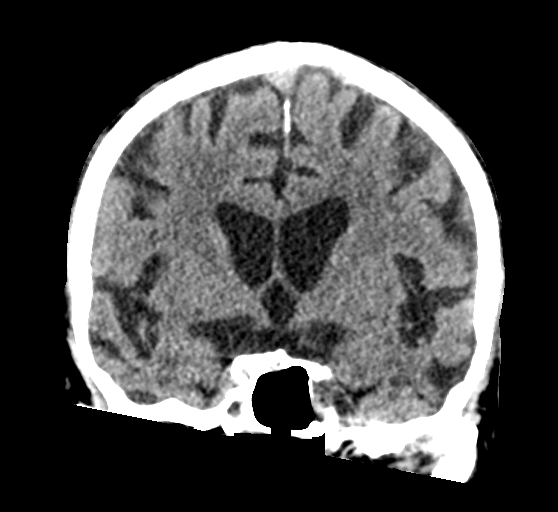
[im 41/74  brain]
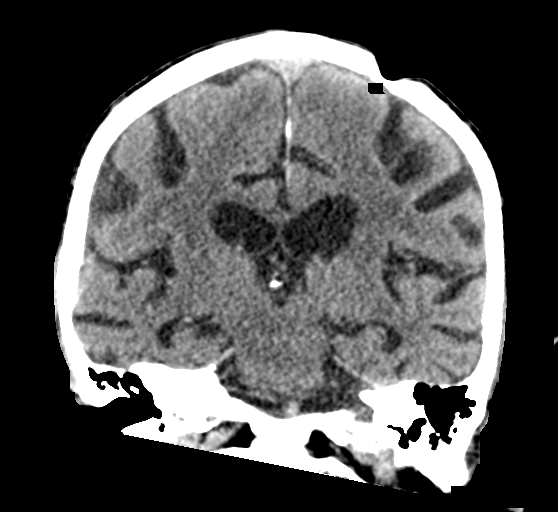

[Series 5: sagittal soft tissue · sagittal · 0.34mm/px · 3 of 54 slices shown]
[im 21/54  brain]
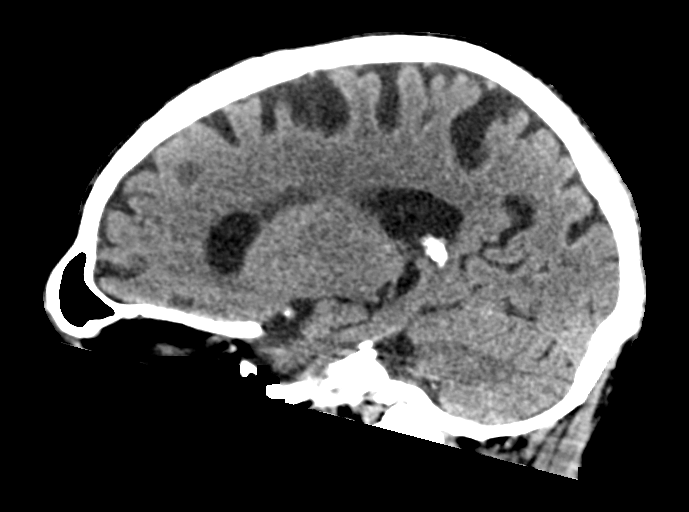
[im 27/54  brain]
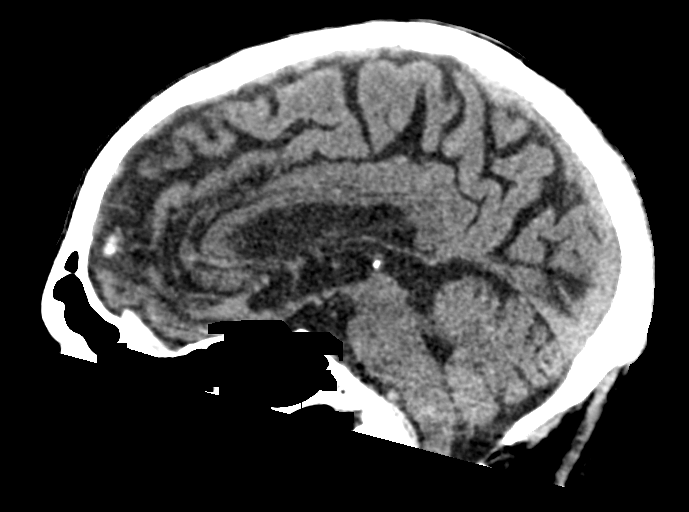
[im 33/54  brain]
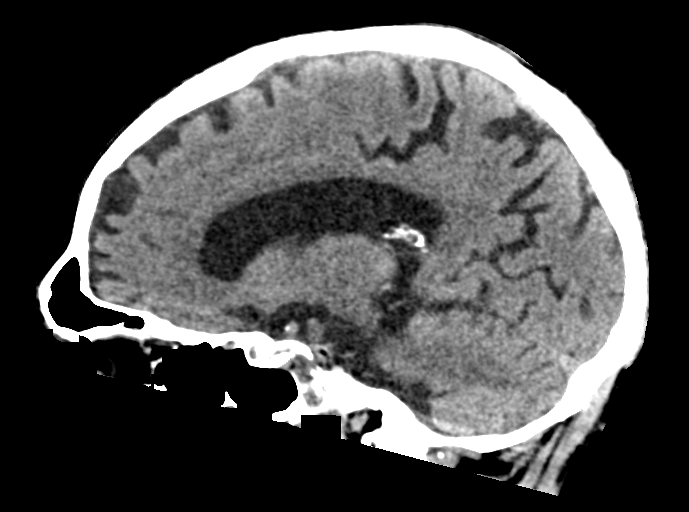

[16 of 47 positions shown; findings below may reference images not displayed]

FINDINGS: Brain: Mild diffuse cortical atrophy is noted. No mass effect or
midline shift is noted. Ventricular size is within normal limits.
There is no evidence of mass lesion, hemorrhage or acute infarction.

Vascular: No hyperdense vessel or unexpected calcification.

Skull: Status post left craniotomy.  No acute abnormality is noted.

Sinuses/Orbits: No acute finding.

Other: None.
IMPRESSION: Mild diffuse cortical atrophy. No acute intracranial abnormality
seen.

## 2021-09-03 IMAGING — CT CT RENAL STONE PROTOCOL
2 of 4 series · 15 of 46 positions shown, 17 images · non-contrast
Comparison: None.

CLINICAL DATA: Hematuria.

EXAM:
CT ABDOMEN AND PELVIS WITHOUT CONTRAST
TECHNIQUE: Multidetector CT imaging of the abdomen and pelvis was performed
following the standard protocol without IV contrast.

[Series 2: stone full standard · axial · 0.72mm/px · z∈[-989,-549]mm · 12 of 101 slices shown, 14 images]
[im 9/101  soft-tissue]
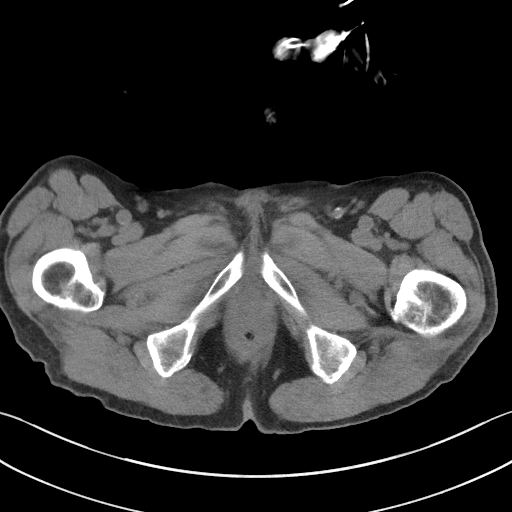
[im 9/101  bone]
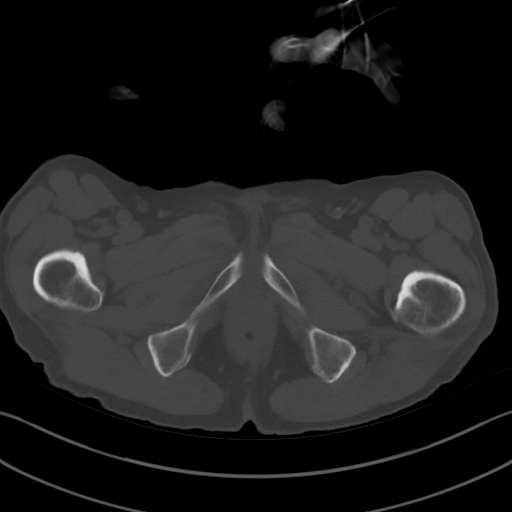
[im 17/101  soft-tissue]
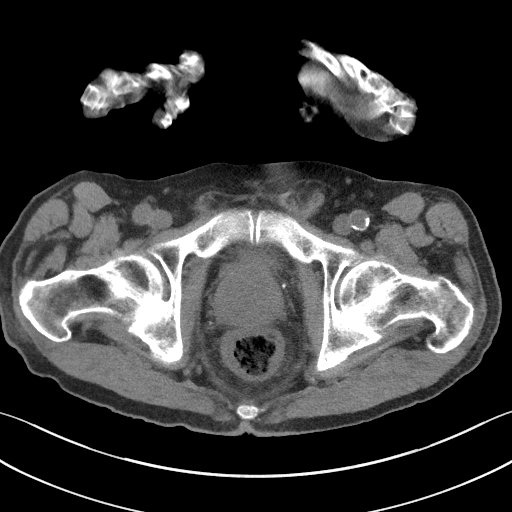
[im 25/101  soft-tissue]
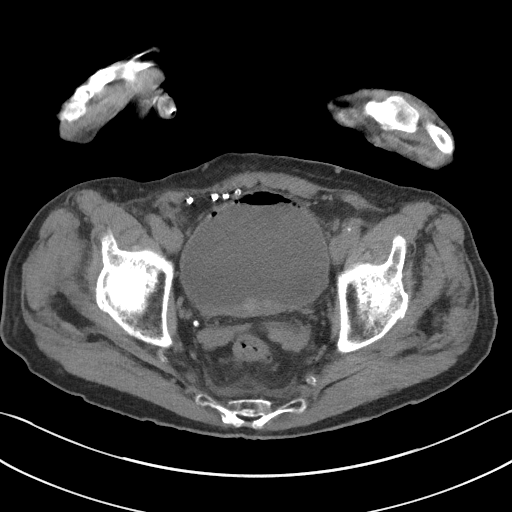
[im 33/101  soft-tissue]
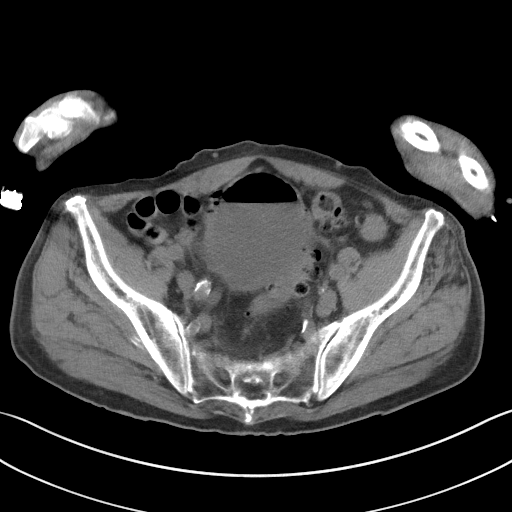
[im 41/101  soft-tissue]
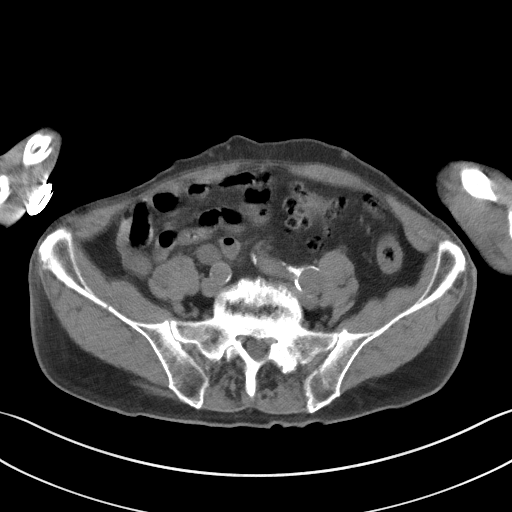
[im 49/101  soft-tissue]
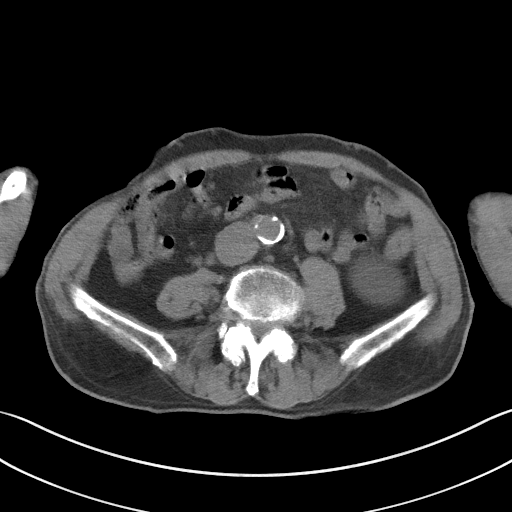
[im 57/101  soft-tissue]
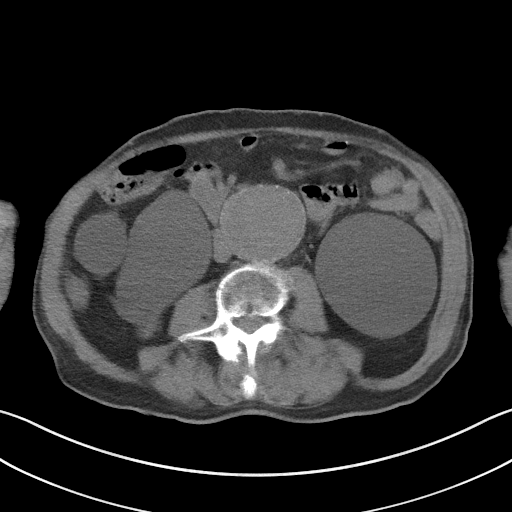
[im 65/101  soft-tissue]
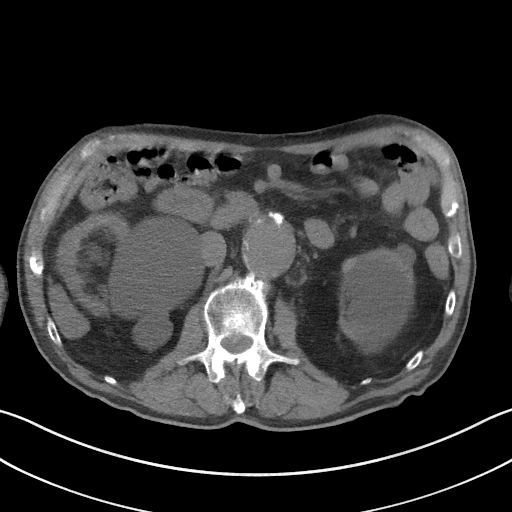
[im 73/101  soft-tissue]
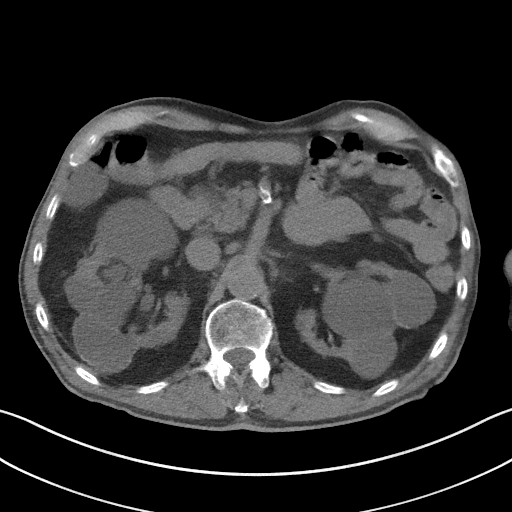
[im 73/101  bone]
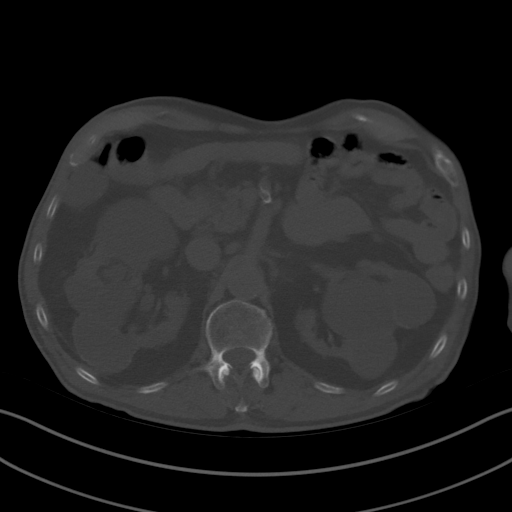
[im 81/101  soft-tissue]
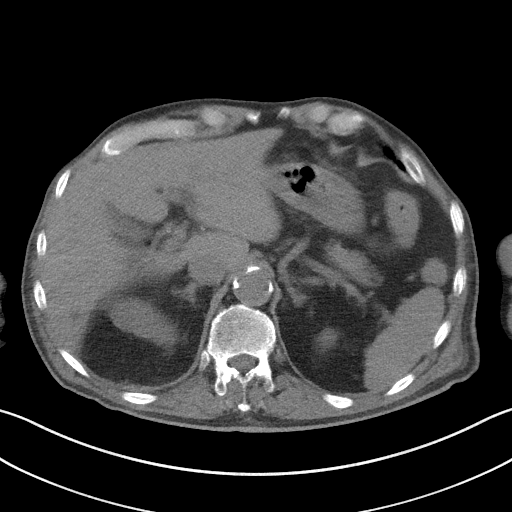
[im 89/101  soft-tissue]
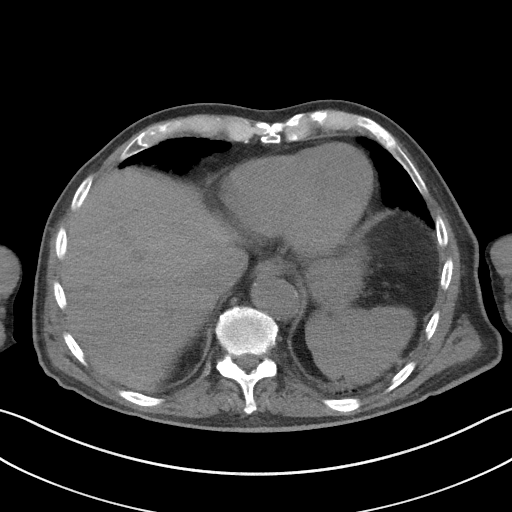
[im 97/101  soft-tissue]
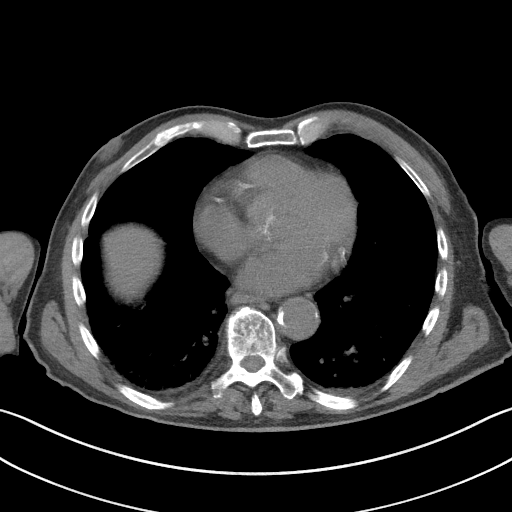

[Series 5: coronal · coronal · 0.71mm/px · 3 of 126 slices shown]
[im 42/126  soft-tissue]
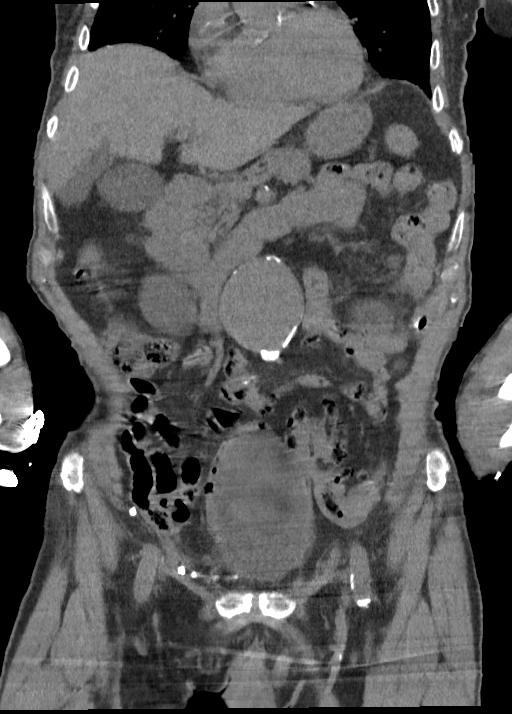
[im 56/126  soft-tissue]
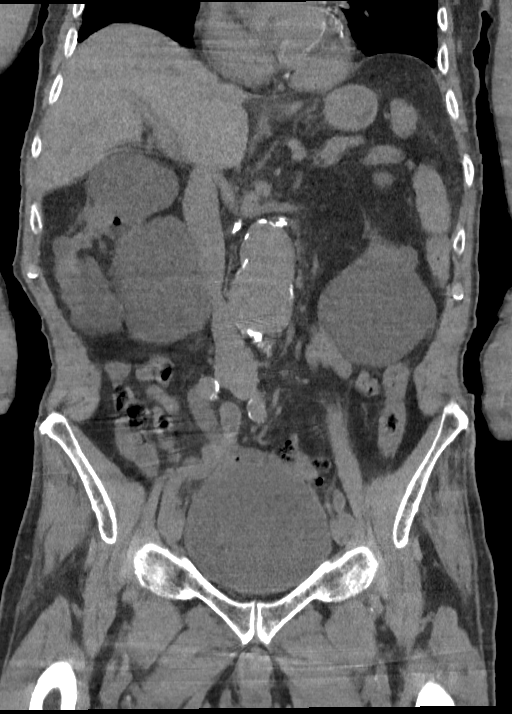
[im 70/126  soft-tissue]
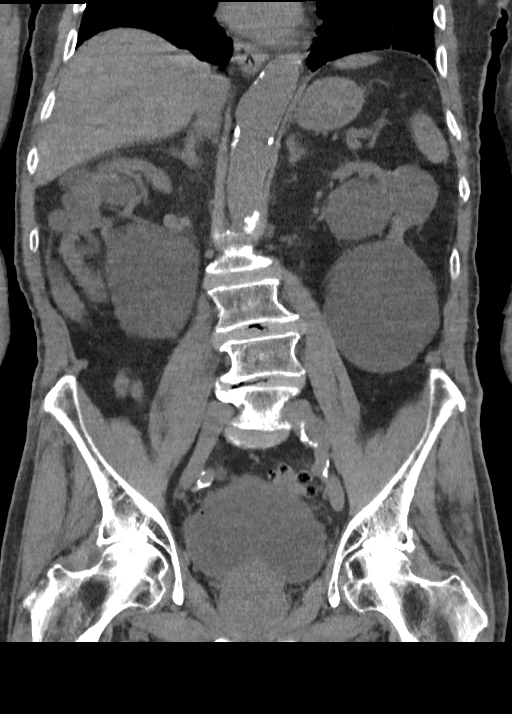

[15 of 46 positions shown; findings below may reference images not displayed]

FINDINGS: Lower chest: Trace bilateral pleural effusions. Mild increased
subpleural reticulation at the lung bases. Minimal dependent
subsegmental atelectasis in both lower lobes.

Hepatobiliary: No focal liver abnormality is seen. No gallstones,
gallbladder wall thickening, or biliary dilatation.

Pancreas: Unremarkable. No pancreatic ductal dilatation or
surrounding inflammatory changes.

Spleen: Normal in size without focal abnormality.

Adrenals/Urinary Tract: The adrenal glands are unremarkable.
Multiple bilateral renal simple cysts measuring up to 8.9 cm on the
right and 9.3 cm on the left. 4.9 cm right and 4.0 cm left minimally
complicated cysts with thin peripheral calcification (series 2,
image 30). 1.4 cm high density lesion in the left kidney, likely a
hemorrhagic or proteinaceous cyst.

Moderate right hydroureteronephrosis with several small foci of air
within the collecting system. No left hydronephrosis. No renal
calculi. Small amount of layering hyperdensity in the posterior
bladder. Layering air within the anterior bladder related to recent
instrumentation. There is also scattered air within the bladder
wall, which is not significantly thickened.

Stomach/Bowel: Stomach is within normal limits. Appendix appears
normal. No evidence of bowel wall thickening, distention, or
inflammatory changes. Severe sigmoid colonic diverticulosis.

Vascular/Lymphatic: Aortic atherosclerosis. Infrarenal abdominal
aortic aneurysm measuring up to 6.2 cm. 2.2 cm left common iliac
artery aneurysm at the external and internal iliac bifurcation. No
enlarged abdominal or pelvic lymph nodes.

Reproductive: Mild prostatomegaly with median lobe hypertrophy
indenting the bladder base.

Other: Prior right inguinal hernia repair. No free fluid her
pneumoperitoneum. Mild presacral stranding.

Musculoskeletal: No acute or significant osseous findings. Chronic
L2 superior endplate compression fracture.
IMPRESSION: 1. Small amount of layering hemorrhage in the posterior bladder.
Scattered air within the bladder wall, suggestive of emphysematous
cystitis.
2. Moderate right hydroureteronephrosis without evident obstructing
lesion or calculi. Small foci of air within the right renal
collecting system, favor reflux from emphysematous cystitis versus
less likely emphysematous pyelitis given degree of air.
3. Infrarenal abdominal aortic aneurysm measuring up to 6.2 cm.
Recommend referral to a vascular specialist. This recommendation
follows ACR consensus guidelines: White Paper of the ACR Incidental
4. Aortic Atherosclerosis (8ZXVH-VO3.3).
